# Patient Record
Sex: Female | Born: 1990 | Hispanic: Yes | Smoking: Never smoker
Health system: Southern US, Community
[De-identification: ages and names within clinical notes are randomized; demographics above are authoritative.]

## PROBLEM LIST (undated history)

## (undated) DIAGNOSIS — IMO0002 Reserved for concepts with insufficient information to code with codable children: Secondary | ICD-10-CM

## (undated) DIAGNOSIS — K5900 Constipation, unspecified: Secondary | ICD-10-CM

## (undated) DIAGNOSIS — G43909 Migraine, unspecified, not intractable, without status migrainosus: Secondary | ICD-10-CM

## (undated) DIAGNOSIS — B009 Herpesviral infection, unspecified: Secondary | ICD-10-CM

## (undated) DIAGNOSIS — J45909 Unspecified asthma, uncomplicated: Secondary | ICD-10-CM

## (undated) DIAGNOSIS — M329 Systemic lupus erythematosus, unspecified: Secondary | ICD-10-CM

## (undated) DIAGNOSIS — R87629 Unspecified abnormal cytological findings in specimens from vagina: Secondary | ICD-10-CM

## (undated) DIAGNOSIS — T7840XA Allergy, unspecified, initial encounter: Secondary | ICD-10-CM

## (undated) HISTORY — PX: OTHER SURGICAL HISTORY: SHX169

## (undated) HISTORY — PX: NO PAST SURGERIES: SHX2092

## (undated) HISTORY — DX: Allergy, unspecified, initial encounter: T78.40XA

## (undated) HISTORY — DX: Unspecified asthma, uncomplicated: J45.909

## (undated) HISTORY — DX: Unspecified abnormal cytological findings in specimens from vagina: R87.629

## (undated) HISTORY — DX: Systemic lupus erythematosus, unspecified: M32.9

## (undated) HISTORY — DX: Herpesviral infection, unspecified: B00.9

---

## 2010-08-24 DIAGNOSIS — N83209 Unspecified ovarian cyst, unspecified side: Secondary | ICD-10-CM

## 2010-08-24 HISTORY — DX: Unspecified ovarian cyst, unspecified side: N83.209

## 2015-03-21 ENCOUNTER — Emergency Department: Payer: Self-pay

## 2015-03-21 ENCOUNTER — Emergency Department
Admission: EM | Admit: 2015-03-21 | Discharge: 2015-03-21 | Disposition: A | Discharge status: Home / Self Care | Payer: Self-pay | Attending: Emergency Medicine | Admitting: Emergency Medicine

## 2015-03-21 DIAGNOSIS — Z880 Allergy status to penicillin: Secondary | ICD-10-CM | POA: Insufficient documentation

## 2015-03-21 DIAGNOSIS — J069 Acute upper respiratory infection, unspecified: Secondary | ICD-10-CM | POA: Insufficient documentation

## 2015-03-21 DIAGNOSIS — Z3202 Encounter for pregnancy test, result negative: Secondary | ICD-10-CM | POA: Insufficient documentation

## 2015-03-21 DIAGNOSIS — N12 Tubulo-interstitial nephritis, not specified as acute or chronic: Secondary | ICD-10-CM | POA: Insufficient documentation

## 2015-03-21 HISTORY — DX: Constipation, unspecified: K59.00

## 2015-03-21 HISTORY — DX: Migraine, unspecified, not intractable, without status migrainosus: G43.909

## 2015-03-21 LAB — COMPREHENSIVE METABOLIC PANEL
ALT: 53 U/L (ref 14–54)
AST: 25 U/L (ref 15–41)
Albumin: 3.4 g/dL — ABNORMAL LOW (ref 3.5–5.0)
Alkaline Phosphatase: 115 U/L (ref 38–126)
Anion gap: 9 (ref 5–15)
BUN: 9 mg/dL (ref 6–20)
CO2: 27 mmol/L (ref 22–32)
Calcium: 8.8 mg/dL — ABNORMAL LOW (ref 8.9–10.3)
Chloride: 96 mmol/L — ABNORMAL LOW (ref 101–111)
Creatinine, Ser: 0.57 mg/dL (ref 0.44–1.00)
GFR calc Af Amer: >60 mL/min (ref >60)
GFR calc non Af Amer: >60 mL/min (ref >60)
Glucose, Bld: 90 mg/dL (ref 65–99)
Potassium: 4 mmol/L (ref 3.5–5.1)
Sodium: 132 mmol/L — ABNORMAL LOW (ref 135–145)
Total Bilirubin: 0.2 mg/dL — ABNORMAL LOW (ref 0.3–1.2)
Total Protein: 8.5 g/dL — ABNORMAL HIGH (ref 6.5–8.1)

## 2015-03-21 LAB — URINALYSIS COMPLETE WITH MICROSCOPIC (ARMC ONLY)
Bilirubin Urine: NEGATIVE
Glucose, UA: NEGATIVE mg/dL
Hgb urine dipstick: NEGATIVE
Ketones, ur: NEGATIVE mg/dL
Nitrite: NEGATIVE
Protein, ur: 30 mg/dL — AB
Specific Gravity, Urine: 1.014 (ref 1.005–1.030)
pH: 9 — ABNORMAL HIGH (ref 5.0–8.0)

## 2015-03-21 LAB — LIPASE, BLOOD: Lipase: 17 U/L — ABNORMAL LOW (ref 22–51)

## 2015-03-21 LAB — CBC
HCT: 36.4 % (ref 35.0–47.0)
Hemoglobin: 12.1 g/dL (ref 12.0–16.0)
MCH: 29.5 pg (ref 26.0–34.0)
MCHC: 33.2 g/dL (ref 32.0–36.0)
MCV: 88.7 fL (ref 80.0–100.0)
Platelets: 291 10*3/uL (ref 150–440)
RBC: 4.11 MIL/uL (ref 3.80–5.20)
RDW: 13.1 % (ref 11.5–14.5)
WBC: 13.9 10*3/uL — ABNORMAL HIGH (ref 3.6–11.0)

## 2015-03-21 LAB — POCT PREGNANCY, URINE: Preg Test, Ur: NEGATIVE

## 2015-03-21 MED ORDER — CEFTRIAXONE SODIUM IN DEXTROSE 20 MG/ML IV SOLN: 1.0000 g | Freq: Once | INTRAVENOUS | Status: DC

## 2015-03-21 MED ORDER — BENZONATATE 100 MG PO CAPS
200.0000 mg | ORAL_CAPSULE | Freq: Once | ORAL | Status: AC
  Administered 2015-03-21: 200 mg via ORAL

## 2015-03-21 MED ORDER — IBUPROFEN 600 MG PO TABS: 600.0000 mg | ORAL_TABLET | Freq: Three times a day (TID) | ORAL | Status: DC | PRN

## 2015-03-21 MED ORDER — IBUPROFEN 600 MG PO TABS
ORAL_TABLET | ORAL | Status: AC
  Administered 2015-03-21: 600 mg via ORAL
  Filled 2015-03-21: qty 1

## 2015-03-21 MED ORDER — BENZONATATE 200 MG PO CAPS: 200.0000 mg | ORAL_CAPSULE | Freq: Three times a day (TID) | ORAL | Status: DC | PRN

## 2015-03-21 MED ORDER — ACETAMINOPHEN 325 MG PO TABS
650.0000 mg | ORAL_TABLET | Freq: Once | ORAL | Status: AC | PRN
  Administered 2015-03-21: 650 mg via ORAL
  Filled 2015-03-21: qty 2

## 2015-03-21 MED ORDER — DEXTROSE 5 % IV SOLN
1.0000 g | Freq: Once | INTRAVENOUS | Status: AC
  Administered 2015-03-21: 1 g via INTRAVENOUS
  Filled 2015-03-21 (×2): qty 10

## 2015-03-21 MED ORDER — BENZONATATE 100 MG PO CAPS
ORAL_CAPSULE | ORAL | Status: AC
Start: 2015-03-21 — End: 2015-03-21
  Administered 2015-03-21: 200 mg via ORAL
  Filled 2015-03-21: qty 2

## 2015-03-21 MED ORDER — IBUPROFEN 400 MG PO TABS
600.0000 mg | ORAL_TABLET | Freq: Once | ORAL | Status: AC
  Administered 2015-03-21: 600 mg via ORAL

## 2015-03-21 MED ORDER — SULFAMETHOXAZOLE-TRIMETHOPRIM 800-160 MG PO TABS: 1.0000 | ORAL_TABLET | Freq: Two times a day (BID) | ORAL | Status: DC

## 2015-03-21 NOTE — ED Provider Notes (Signed)
[facility] Emergency Department Provider Note   ____________________________________________  Time seen: 6 PM I have reviewed the triage vital signs and the triage nursing note.  HISTORY  Chief Complaint Fever; Sore Throat; and Abdominal Pain   Historian Patient, through Spanish interpreter  HPI Brittany Cardenas is a 24 y.o. female who is complaining of right-sided flank and abdominal pain for about one week. Symptoms are getting worse. She has had generalized fatigue and muscle aches all over.She's had some headaches. She has had constipation for over one week and took a laxative 2 days ago. She is also having a sore throat and a cough that is nonproductive.    Past Medical History  Diagnosis Date  . Constipation   . Migraine     There are no active problems to display for this patient.   History reviewed. No pertinent past surgical history.  Current Outpatient Rx  Name  Route  Sig  Dispense  Refill  . benzonatate (TESSALON) 200 MG capsule   Oral   Take 1 capsule (200 mg total) by mouth 3 (three) times daily as needed for cough.   20 capsule   0   . ibuprofen (ADVIL,MOTRIN) 600 MG tablet   Oral   Take 1 tablet (600 mg total) by mouth every 8 (eight) hours as needed.   20 tablet   0   . sulfamethoxazole-trimethoprim (BACTRIM DS,SEPTRA DS) 800-160 MG per tablet   Oral   Take 1 tablet by mouth 2 (two) times daily.   20 tablet   0     Allergies Penicillins caused slight rash when she was a child  No family history on file.  Social History History  Substance Use Topics  . Smoking status: Never Smoker   . Smokeless tobacco: Not on file  . Alcohol Use: No    Review of Systems  Constitutional: Positive for fever. Eyes: Negative for visual changes. ENT: Positive for sore throat. Cardiovascular: Negative for chest pain. Respiratory: Negative for shortness of breath. Positive for cough Gastrointestinal: Negative for,  vomiting and diarrhea. Genitourinary: Negative for dysuria. Musculoskeletal: Negative for back pain. Skin: Negative for rash. Neurological: Negative for headaches, focal weakness or numbness. 10 point Review of Systems otherwise negative ____________________________________________   PHYSICAL EXAM:  VITAL SIGNS: ED Triage Vitals  Enc Vitals Group     BP 03/21/15 1405 129/80 mmHg     Pulse Rate 03/21/15 1405 128     Resp 03/21/15 1405 18     Temp 03/21/15 1405 103.1 F (39.5 C)     Temp Source 03/21/15 1405 Oral     SpO2 03/21/15 1405 99 %     Weight 03/21/15 1405 86 lb 14 oz (39.406 kg)     Height 03/21/15 1405  (1.575 m)     Head Cir --      Peak Flow --      Pain Score 03/21/15 1408 10     Pain Loc --      Pain Edu? --      Excl. in GC? --      Constitutional: Alert and oriented. Well appearing and in no distress. Eyes: Conjunctivae are normal. PERRL. Normal extraocular movements. ENT   Head: Normocephalic and atraumatic.   Nose: No congestion/rhinnorhea.   Mouth/Throat: Mucous membranes are moist. Moderate pharyngeal erythema without swollen tonsils or tonsillar exudates.   Neck: No stridor. Cardiovascular/Chest: Normal rate, regular rhythm.  No murmurs, rubs, or gallops. Respiratory: Normal respiratory effort without tachypnea  nor retractions. Breath sounds are clear and equal bilaterally. No wheezes/rales/rhonchi. Gastrointestinal: Soft. No distention, no guarding, no rebound. Nontender  Genitourinary/rectal:Deferred Musculoskeletal: Nontender with normal range of motion in all extremities. No joint effusions.  No lower extremity tenderness nor edema. Neurologic:  Normal speech and language. No gross or focal neurologic deficits are appreciated. Skin:  Skin is warm, dry and intact. No rash noted. Psychiatric: Mood and affect are normal. Speech and behavior are normal. Patient exhibits appropriate insight and  judgment.  ____________________________________________   EKG I, Governor Rooks, MD, the attending physician have personally viewed and interpreted all ECGs.  None ____________________________________________  LABS (pertinent positives/negatives)  Rapid strep negative Pregnancy test negative Lipase low 17 Complete metabolic panel significant for sodium 132, chloride 96 and LFTs within normal limits White blood cell count is 13.9, hemoglobin 12.1 Urinalysis positive for 3+ leukocytes, 6-30 red blood cells, too numerous to count white blood cells and a few bacteria  ____________________________________________  RADIOLOGY All Xrays were viewed by me. Imaging interpreted by Radiologist.  Chest x-ray: Normal __________________________________________  PROCEDURES  Procedure(s) performed: None Critical Care performed: None  ____________________________________________   ED COURSE / ASSESSMENT AND PLAN  CONSULTATIONS: None  Pertinent labs & imaging results that were available during my care of the patient were reviewed by me and considered in my medical decision making (see chart for details). I think the source of the patient's fever is pyelonephritis. She is overall well appearing but did have a fever with elevated white blood cell count and a urinalysis consistent with a urinary tract infection. No hypotension, and she is overall well-appearing at the she is stable for outpatient treatment. She didn't receive one dose of IV Rocephin here in the emergency department followed by Bactrim antibiotic starting tomorrow. In terms of the upper respiratory symptoms, I think is more viral upper respiratory infection with reassuring chest x-ray. I deferred a pelvic exam as she is not having specific pelvic complaints, and there is no clear source of her symptoms which is the urinary tract infection/pyelonephritis.  Patient / Family / Caregiver informed of clinical course, medical  decision-making process, and agree with plan.   I discussed return precautions, follow-up instructions, and discharged instructions with patient and/or family.  ___________________________________________   FINAL CLINICAL IMPRESSION(S) / ED DIAGNOSES   Final diagnoses:  Pyelonephritis  Upper respiratory infection, viral    FOLLOW UP  Referred to: Primary care follow-up, [facility] clinic   Governor Rooks, MD 03/21/15 [phone]

## 2015-03-21 NOTE — ED Notes (Signed)
MD at bedside. 

## 2015-03-21 NOTE — ED Notes (Signed)
Pt c/o fever, headache, generalized bone aching, abdominal pain, constipation x 1 week. Sore throat, nonproductive cough.

## 2015-03-21 NOTE — Discharge Instructions (Signed)
Return to the emergency department for any worsening condition including pain, vomiting, not making urine, trouble breathing or any other symptoms concerning to you.     Pyelonephritis, Adult Pyelonephritis is a kidney infection. In general, there are 2 main types of pyelonephritis:  Infections that come on quickly without any warning (acute pyelonephritis).  Infections that persist for a long period of time (chronic pyelonephritis). CAUSES  Two main causes of pyelonephritis are:  Bacteria traveling from the bladder to the kidney. This is a problem especially in pregnant women. The urine in the bladder can become filled with bacteria from multiple causes, including:  Inflammation of the prostate gland (prostatitis).  Sexual intercourse in females.  Bladder infection (cystitis).  Bacteria traveling from the bloodstream to the tissue part of the kidney. Problems that may increase your risk of getting a kidney infection include:  Diabetes.  Kidney stones or bladder stones.  Cancer.  Catheters placed in the bladder.  Other abnormalities of the kidney or ureter. SYMPTOMS   Abdominal pain.  Pain in the side or flank area.  Fever.  Chills.  Upset stomach.  Blood in the urine (dark urine).  Frequent urination.  Strong or persistent urge to urinate.  Burning or stinging when urinating. DIAGNOSIS  Your caregiver may diagnose your kidney infection based on your symptoms. A urine sample may also be taken. TREATMENT  In general, treatment depends on how severe the infection is.   If the infection is mild and caught early, your caregiver may treat you with oral antibiotics and send you home.  If the infection is more severe, the bacteria may have gotten into the bloodstream. This will require intravenous (IV) antibiotics and a hospital stay. Symptoms may include:  High fever.  Severe flank pain.  Shaking chills.  Even after a hospital stay, your caregiver may  require you to be on oral antibiotics for a period of time.  Other treatments may be required depending upon the cause of the infection. HOME CARE INSTRUCTIONS   Take your antibiotics as directed. Finish them even if you start to feel better.  Make an appointment to have your urine checked to make sure the infection is gone.  Drink enough fluids to keep your urine clear or pale yellow.  Take medicines for the bladder if you have urgency and frequency of urination as directed by your caregiver. SEEK IMMEDIATE MEDICAL CARE IF:   You have a fever or persistent symptoms for more than 2-3 days.  You have a fever and your symptoms suddenly get worse.  You are unable to take your antibiotics or fluids.  You develop shaking chills.  You experience extreme weakness or fainting.  There is no improvement after 2 days of treatment. MAKE SURE YOU:  Understand these instructions.  Will watch your condition.  Will get help right away if you are not doing well or get worse. Document Released: 08/10/2005 Document Revised: 02/09/2012 Document Reviewed: 01/14/2011 [employer] Patient Information 2015 [place], [place]. This information is not intended to replace advice given to you by your health care provider. Make sure you discuss any questions you have with your health care provider.  Upper Respiratory Infection, Adult An upper respiratory infection (URI) is also known as the common cold. It is often caused by a type of germ (virus). Colds are easily spread (contagious). You can pass it to others by kissing, coughing, sneezing, or drinking out of the same glass. Usually, you get better in 1 or 2 weeks.  HOME  CARE   Only take medicine as told by your doctor.  Use a warm mist humidifier or breathe in steam from a hot shower.  Drink enough water and fluids to keep your pee (urine) clear or pale yellow.  Get plenty of rest.  Return to work when your temperature is back to normal or as told by  your doctor. You may use a face mask and wash your hands to stop your cold from spreading. GET HELP RIGHT AWAY IF:   After the first few days, you feel you are getting worse.  You have questions about your medicine.  You have chills, shortness of breath, or brown or red spit (mucus).  You have yellow or brown snot (nasal discharge) or pain in the face, especially when you bend forward.  You have a fever, puffy (swollen) neck, pain when you swallow, or white spots in the back of your throat.  You have a bad headache, ear pain, sinus pain, or chest pain.  You have a high-pitched whistling sound when you breathe in and out (wheezing).  You have a lasting cough or cough up blood.  You have sore muscles or a stiff neck. MAKE SURE YOU:   Understand these instructions.  Will watch your condition.  Will get help right away if you are not doing well or get worse. Document Released: 01/27/2008 Document Revised: 11/02/2011 Document Reviewed: 11/15/2013 [facility] Patient Information 2015 [place], [place]. This information is not intended to replace advice given to you by your health care provider. Make sure you discuss any questions you have with your health care provider.

## 2015-03-21 NOTE — ED Notes (Signed)
Patient c/o right upper abdominal pain, spine pain, and a headache that has been going on for a week.  Patient states that she had not had a bowel movement for 2 weeks until she took a laxative 2 days ago.  States that she has lost ~20lb in the past week.

## 2015-03-23 LAB — CULTURE, GROUP A STREP (THRC)

## 2015-10-11 DIAGNOSIS — N879 Dysplasia of cervix uteri, unspecified: Secondary | ICD-10-CM | POA: Insufficient documentation

## 2016-04-06 ENCOUNTER — Emergency Department
Admission: EM | Admit: 2016-04-06 | Discharge: 2016-04-07 | Disposition: A | Discharge status: Home / Self Care | Payer: Self-pay | Attending: Emergency Medicine | Admitting: Emergency Medicine

## 2016-04-06 DIAGNOSIS — Z79899 Other long term (current) drug therapy: Secondary | ICD-10-CM | POA: Insufficient documentation

## 2016-04-06 DIAGNOSIS — N12 Tubulo-interstitial nephritis, not specified as acute or chronic: Secondary | ICD-10-CM | POA: Insufficient documentation

## 2016-04-06 NOTE — ED Triage Notes (Addendum)
Patient ambulatory to triage with steady gait, without difficulty or distress noted; pt reports pain to right lower abd /back with fever and chills; miscarriage 7/30 due to rH negative blood type

## 2016-04-07 LAB — COMPREHENSIVE METABOLIC PANEL
ALT: 22 U/L (ref 14–54)
AST: 19 U/L (ref 15–41)
Albumin: 4.2 g/dL (ref 3.5–5.0)
Alkaline Phosphatase: 83 U/L (ref 38–126)
Anion gap: 11 (ref 5–15)
BUN: 5 mg/dL — ABNORMAL LOW (ref 6–20)
CO2: 24 mmol/L (ref 22–32)
Calcium: 9.1 mg/dL (ref 8.9–10.3)
Chloride: 103 mmol/L (ref 101–111)
Creatinine, Ser: 0.71 mg/dL (ref 0.44–1.00)
GFR calc Af Amer: >60 mL/min (ref >60)
GFR calc non Af Amer: >60 mL/min (ref >60)
Glucose, Bld: 107 mg/dL — ABNORMAL HIGH (ref 65–99)
Potassium: 3.3 mmol/L — ABNORMAL LOW (ref 3.5–5.1)
Sodium: 138 mmol/L (ref 135–145)
Total Bilirubin: 0.7 mg/dL (ref 0.3–1.2)
Total Protein: 8.3 g/dL — ABNORMAL HIGH (ref 6.5–8.1)

## 2016-04-07 LAB — CBC WITH DIFFERENTIAL/PLATELET
Basophils Absolute: 0 10*3/uL (ref 0–0.1)
Basophils Relative: 0 %
Eosinophils Absolute: 0.1 10*3/uL (ref 0–0.7)
Eosinophils Relative: 1 %
HCT: 38.3 % (ref 35.0–47.0)
Hemoglobin: 13.1 g/dL (ref 12.0–16.0)
Lymphocytes Relative: 9 %
Lymphs Abs: 1.3 10*3/uL (ref 1.0–3.6)
MCH: 29.9 pg (ref 26.0–34.0)
MCHC: 34.2 g/dL (ref 32.0–36.0)
MCV: 87.4 fL (ref 80.0–100.0)
Monocytes Absolute: 1.3 10*3/uL — ABNORMAL HIGH (ref 0.2–0.9)
Monocytes Relative: 9 %
Neutro Abs: 11.3 10*3/uL — ABNORMAL HIGH (ref 1.4–6.5)
Neutrophils Relative %: 81 %
Platelets: 221 10*3/uL (ref 150–440)
RBC: 4.38 MIL/uL (ref 3.80–5.20)
RDW: 13.1 % (ref 11.5–14.5)
WBC: 13.9 10*3/uL — ABNORMAL HIGH (ref 3.6–11.0)

## 2016-04-07 LAB — URINALYSIS COMPLETE WITH MICROSCOPIC (ARMC ONLY)
Bilirubin Urine: NEGATIVE
Glucose, UA: NEGATIVE mg/dL
Nitrite: POSITIVE — AB
Protein, ur: 30 mg/dL — AB
Specific Gravity, Urine: 1.017 (ref 1.005–1.030)
pH: 5 (ref 5.0–8.0)

## 2016-04-07 LAB — LACTIC ACID, PLASMA: Lactic Acid, Venous: 0.7 mmol/L (ref 0.5–1.9)

## 2016-04-07 LAB — LIPASE, BLOOD: Lipase: 17 U/L (ref 11–51)

## 2016-04-07 LAB — POCT PREGNANCY, URINE: Preg Test, Ur: NEGATIVE

## 2016-04-07 MED ORDER — CEPHALEXIN 500 MG PO CAPS: 500.0000 mg | ORAL_CAPSULE | Freq: Three times a day (TID) | ORAL | 0 refills | Status: DC

## 2016-04-07 MED ORDER — MORPHINE SULFATE (PF) 4 MG/ML IV SOLN
INTRAVENOUS | Status: AC
  Administered 2016-04-07: 4 mg via INTRAVENOUS
  Filled 2016-04-07: qty 1

## 2016-04-07 MED ORDER — SODIUM CHLORIDE 0.9 % IV BOLUS (SEPSIS)
1000.0000 mL | Freq: Once | INTRAVENOUS | Status: AC
  Administered 2016-04-07: 1000 mL via INTRAVENOUS

## 2016-04-07 MED ORDER — CEFTRIAXONE SODIUM 1 G IJ SOLR
1.0000 g | Freq: Once | INTRAMUSCULAR | Status: AC
  Administered 2016-04-07: 1 g via INTRAVENOUS
  Filled 2016-04-07: qty 10

## 2016-04-07 MED ORDER — MORPHINE SULFATE (PF) 4 MG/ML IV SOLN
4.0000 mg | Freq: Once | INTRAVENOUS | Status: AC
  Administered 2016-04-07: 4 mg via INTRAVENOUS

## 2016-04-07 NOTE — Discharge Instructions (Signed)
Please seek medical attention for any high fevers, chest pain, shortness of breath, change in behavior, persistent vomiting, bloody stool or any other new or concerning symptoms.  

## 2016-04-07 NOTE — ED Notes (Signed)
Pt is complaining of right flank and lower back pain.  Reports nausea and vomiting over the past 3 days. Has a self-reported history of constipation but reports no change in her bowel pattern. Has reported no new urinary symptoms. Feels as though she has had fevers and chills but has not documented a fever using a thermometer.

## 2016-04-07 NOTE — ED Provider Notes (Signed)
[facility] Emergency Department Provider Note   ____________________________________________   I have reviewed the triage vital signs and the nursing notes.   HISTORY  Chief Complaint Right lower abd/back pain  History limited by: Not Limited   HPI Brittany Cardenas is a 25 y.o. female who presents to the emergency department today because of concern for right sided pain. The patient states the pain has been present for about one week. It is sharp. It is constant. She has tried taking ibuprofen without significant relief. The patient additionally has been having fevers. She denies any dysuria or change in defecation, although she does state she is typically constipated. The patient was seen in the emergency department roughly one year ago for somewhat similar complaints and was diagnosed with pyelonephritis. The patient thinks that this might be similar.   Past Medical History:  Diagnosis Date  . Constipation   . Migraine     There are no active problems to display for this patient.   No past surgical history on file.  Prior to Admission medications   Medication Sig Start Date End Date Taking? Authorizing Provider  benzonatate (TESSALON) 200 MG capsule Take 1 capsule (200 mg total) by mouth 3 (three) times daily as needed for cough. 03/21/15   Governor Rooksebecca Lord, MD  ibuprofen (ADVIL,MOTRIN) 600 MG tablet Take 1 tablet (600 mg total) by mouth every 8 (eight) hours as needed. 03/21/15   Governor Rooksebecca Lord, MD  sulfamethoxazole-trimethoprim (BACTRIM DS,SEPTRA DS) 800-160 MG per tablet Take 1 tablet by mouth 2 (two) times daily. 03/21/15   Governor Rooksebecca Lord, MD    Allergies Penicillins  No family history on file.  Social History Social History  Substance Use Topics  . Smoking status: Never Smoker  . Smokeless tobacco: Not on file  . Alcohol use No    Review of Systems  Constitutional: Positive for fever. Cardiovascular: Negative for chest  pain. Respiratory: Negative for shortness of breath. Gastrointestinal: Positive for right abdominal pain. Genitourinary: Negative for dysuria. Skin: Negative for rash. Neurological: Negative for headaches, focal weakness or numbness.  10-point ROS otherwise negative.  ____________________________________________   PHYSICAL EXAM:  VITAL SIGNS: ED Triage Vitals  Enc Vitals Group     BP 04/06/16 2340 118/75     Pulse Rate 04/06/16 2340 (!) 132     Resp 04/06/16 2340 (!) 22     Temp 04/06/16 2340 98.4 F (36.9 C)     Temp src --      SpO2 04/06/16 2340 97 %     Weight --      Height 04/06/16 2341 5' (1.524 m)     Head Circumference --      Peak Flow --      Pain Score 04/06/16 2341 8   Constitutional: Alert and oriented. Well appearing and in no distress. Eyes: Conjunctivae are normal. PERRL. Normal extraocular movements. ENT   Head: Normocephalic and atraumatic.   Nose: No congestion/rhinnorhea.   Mouth/Throat: Mucous membranes are moist.   Neck: No stridor. Hematological/Lymphatic/Immunilogical: No cervical lymphadenopathy. Cardiovascular: tachycardic, regular rhythm.  No murmurs, rubs, or gallops. Respiratory: Normal respiratory effort without tachypnea nor retractions. Breath sounds are clear and equal bilaterally. No wheezes/rales/rhonchi. Gastrointestinal: Soft and minimally tender in the right lower quadrant. No distention. Mild right sided cva tenderness.  Genitourinary: Deferred Musculoskeletal: Normal range of motion in all extremities. No joint effusions.  No lower extremity tenderness nor edema. Neurologic:  Normal speech and language. No gross focal neurologic deficits are appreciated.  Skin:  Skin is warm, dry and intact. No rash noted. Psychiatric: Mood and affect are normal. Speech and behavior are normal. Patient exhibits appropriate insight and judgment.  ____________________________________________    LABS (pertinent  positives/negatives)  Labs Reviewed  CBC WITH DIFFERENTIAL/PLATELET - Abnormal; Notable for the following:       Result Value   WBC 13.9 (*)    Neutro Abs 11.3 (*)    Monocytes Absolute 1.3 (*)    All other components within normal limits  COMPREHENSIVE METABOLIC PANEL - Abnormal; Notable for the following:    Potassium 3.3 (*)    Glucose, Bld 107 (*)    BUN 5 (*)    Total Protein 8.3 (*)    All other components within normal limits  URINALYSIS COMPLETEWITH MICROSCOPIC ([facility] ONLY) - Abnormal; Notable for the following:    Color, Urine AMBER (*)    APPearance CLOUDY (*)    Ketones, ur 1+ (*)    Hgb urine dipstick 2+ (*)    Protein, ur 30 (*)    Nitrite POSITIVE (*)    Leukocytes, UA 3+ (*)    Bacteria, UA RARE (*)    Squamous Epithelial / LPF 6-30 (*)    All other components within normal limits  URINE CULTURE  LACTIC ACID, PLASMA  LIPASE, BLOOD  POC URINE PREG, ED  POCT PREGNANCY, URINE     ____________________________________________   EKG  I, Phineas SemenGraydon Gloriann Riede, attending physician, personally viewed and interpreted this EKG  EKG Time: 0026 Rate: 112 Rhythm: sinus tachycardia Axis: normal Intervals: qtc 417 QRS: narrow ST changes: no st elevation Impression: abnormal ekg   ____________________________________________    RADIOLOGY  None  ____________________________________________   PROCEDURES  Procedures  ____________________________________________   INITIAL IMPRESSION / ASSESSMENT AND PLAN / ED COURSE  Pertinent labs & imaging results that were available during my care of the patient were reviewed by me and considered in my medical decision making (see chart for details).  She presented to the emergency department today because of concerns for right-sided abdominal pain and fever. Workup was consistent with pyelonephritis. Patient's heart rate did improve with IV fluids. Patient was given dose of IV Rocephin here and will given prescription  for Keflex. Additionally will give patient multiple primary care physician follow-up options.   ____________________________________________   FINAL CLINICAL IMPRESSION(S) / ED DIAGNOSES  Final diagnoses:  Pyelonephritis     Note: This dictation was prepared with Dragon dictation. Any transcriptional errors that result from this process are unintentional    Phineas SemenGraydon Aydrien Froman, MD 04/07/16 [phone]

## 2016-04-09 LAB — URINE CULTURE: Culture: >=100000 — AB

## 2016-04-10 NOTE — Progress Notes (Signed)
25 yo female who presented to the ED on 04/07/16. Reviewed urine culture and patient discharged on appropriate antibiotic therapy.   Brittany Cardenas  3:30 PM 04/10/2016

## 2018-11-03 ENCOUNTER — Emergency Department
Admission: EM | Admit: 2018-11-03 | Discharge: 2018-11-03 | Disposition: A | Discharge status: Home / Self Care | Payer: Self-pay | Attending: Emergency Medicine | Admitting: Emergency Medicine

## 2018-11-03 ENCOUNTER — Encounter: Payer: Self-pay | Admitting: Emergency Medicine

## 2018-11-03 ENCOUNTER — Emergency Department: Payer: Self-pay

## 2018-11-03 ENCOUNTER — Other Ambulatory Visit: Payer: Self-pay

## 2018-11-03 DIAGNOSIS — J209 Acute bronchitis, unspecified: Secondary | ICD-10-CM | POA: Insufficient documentation

## 2018-11-03 MED ORDER — PSEUDOEPH-BROMPHEN-DM 30-2-10 MG/5ML PO SYRP: 5.0000 mL | ORAL_SOLUTION | Freq: Four times a day (QID) | ORAL | 0 refills | Status: DC | PRN

## 2018-11-03 MED ORDER — AZITHROMYCIN 250 MG PO TABS: ORAL_TABLET | ORAL | 0 refills | Status: AC

## 2018-11-03 NOTE — ED Notes (Signed)
See triage note  Presents with cough  States cough is prod at times  Afebrile on arrival

## 2018-11-03 NOTE — ED Provider Notes (Addendum)
[facility] Emergency Department Provider Note   ____________________________________________   First MD Initiated Contact with Patient 11/03/18 1545     (approximate)  I have reviewed the triage vital signs and the nursing notes.   HISTORY  Chief Complaint Cough    HPI Symphanie Smallridge is a 28 y.o. female patient complain productive cough for 3 days.  Patient unsure of fever.  Patient also has a sore throat secondary to postnasal drainage.  Patient denies nausea, vomiting, diarrhea.  Patient has taken flu shot for this season.  Patient denies chest pain or shortness of breath.  No palliative measure for complaint.         Past Medical History:  Diagnosis Date  . Constipation   . Migraine     There are no active problems to display for this patient.   History reviewed. No pertinent surgical history.  Prior to Admission medications   Medication Sig Start Date End Date Taking? Authorizing Provider  azithromycin (ZITHROMAX Z-PAK) 250 MG tablet Take 2 tablets (500 mg) on  Day 1,  followed by 1 tablet (250 mg) once daily on Days 2 through 5. 11/03/18 11/08/18  Joni Reining, PA-C  brompheniramine-pseudoephedrine-DM 30-2-10 MG/5ML syrup Take 5 mLs by mouth 4 (four) times daily as needed. 11/03/18   Joni Reining, PA-C  cephALEXin (KEFLEX) 500 MG capsule Take 1 capsule (500 mg total) by mouth 3 (three) times daily. 04/07/16   Phineas Semen, MD    Allergies Penicillins  History reviewed. No pertinent family history.  Social History Social History   Tobacco Use  . Smoking status: Never Smoker  . Smokeless tobacco: Never Used  Substance Use Topics  . Alcohol use: No  . Drug use: Not on file    Review of Systems  Constitutional: No fever/chills Eyes: No visual changes. ENT: Nasal congestion and sore throat.   Cardiovascular: Denies chest pain. Respiratory: Denies shortness of breath. Gastrointestinal: No abdominal pain.  No  nausea, no vomiting.  No diarrhea.  No constipation. Genitourinary: Negative for dysuria. Musculoskeletal: Negative for back pain. Skin: Negative for rash. Neurological: Negative for headaches, focal weakness or numbness. Allergic/Immunilogical: Penicillin. ____________________________________________   PHYSICAL EXAM:  VITAL SIGNS: ED Triage Vitals  Enc Vitals Group     BP 11/03/18 1526 125/67     Pulse Rate 11/03/18 1526 85     Resp 11/03/18 1526 20     Temp 11/03/18 1526 98.5 F (36.9 C)     Temp Source 11/03/18 1526 Oral     SpO2 11/03/18 1526 98 %     Weight 11/03/18 1527 142 lb 9.6 oz (64.7 kg)     Height 11/03/18 1527 5\' 4"  (1.626 m)     Head Circumference --      Peak Flow --      Pain Score --      Pain Loc --      Pain Edu? --      Excl. in GC? --     Constitutional: Alert and oriented. Well appearing and in no acute distress. Nose: Edematous nasal turbinates clear rhinorrhea. Mouth/Throat: Mucous membranes are moist.  Oropharynx non-erythematous.  Nasal drainage. Neck: No stridor. Hematological/Lymphatic/Immunilogical: No cervical lymphadenopathy. Cardiovascular: Normal rate, regular rhythm. Grossly normal heart sounds.  Good peripheral circulation. Respiratory: Normal respiratory effort.  No retractions. Lungs bilateral rales. Neurologic:  Normal speech and language. No gross focal neurologic deficits are appreciated. No gait instability. Skin:  Skin is warm, dry and intact. No  rash noted. Psychiatric: Mood and affect are normal. Speech and behavior are normal.  ____________________________________________   LABS (all labs ordered are listed, but only abnormal results are displayed)  Labs Reviewed - No data to display ____________________________________________  EKG EKG read by heart station Dr. with no acute findings.  ____________________________________________  RADIOLOGY  ED MD interpretation:    Official radiology report(s): Dg Chest 2 View   Result Date: 11/03/2018 CLINICAL DATA:  Pt presents to ED via POV with her daughter who is also a patient with c/o cough, pt states hx of bronchitis. Pt states phlegm is frothy and white. Never a smoker EXAM: CHEST - 2 VIEW COMPARISON:  03/13/2015 FINDINGS: The heart size and mediastinal contours are within normal limits. Both lungs are clear. No pleural effusion or pneumothorax. The visualized skeletal structures are unremarkable. IMPRESSION: Normal chest radiographs. Electronically Signed   By: Amie Portland M.D.   On: 11/03/2018 16:01    ____________________________________________   PROCEDURES  Procedure(s) performed (including Critical Care):  Procedures   ____________________________________________   INITIAL IMPRESSION / ASSESSMENT AND PLAN / ED COURSE  As part of my medical decision making, I reviewed the following data within the electronic MEDICAL RECORD NUMBER         Presents with 3 days of productive cough.  Physical exam is consistent with bronchitis.  Discussed x-ray findings with patient.  Patient given discharge care instruction advised take medication as directed.  [facility]      ____________________________________________   FINAL CLINICAL IMPRESSION(S) / ED DIAGNOSES  Final diagnoses:  Acute bronchitis, unspecified organism     ED Discharge Orders         Ordered    azithromycin (ZITHROMAX Z-PAK) 250 MG tablet     11/03/18 1620    brompheniramine-pseudoephedrine-DM 30-2-10 MG/5ML syrup  4 times daily PRN     11/03/18 1620           Note:  This document was prepared using Dragon voice recognition software and may include unintentional dictation errors.    Joni Reining, PA-C 11/03/18 1625    Joni Reining, PA-C 11/03/18 1630    Sharman Cheek, MD 11/07/18 [phone]

## 2018-11-03 NOTE — ED Triage Notes (Addendum)
Pt presents to ED via POV with her daughter who is also a patient with c/o cough, pt states hx of bronchitis. Pt states phlegm is frothy and white.

## 2019-06-27 ENCOUNTER — Other Ambulatory Visit: Payer: Self-pay

## 2019-06-27 DIAGNOSIS — Z20822 Contact with and (suspected) exposure to covid-19: Secondary | ICD-10-CM

## 2019-06-28 LAB — NOVEL CORONAVIRUS, NAA: SARS-CoV-2, NAA: NOT DETECTED

## 2019-10-04 ENCOUNTER — Ambulatory Visit: Payer: Self-pay | Attending: Internal Medicine

## 2019-12-09 ENCOUNTER — Ambulatory Visit: Payer: Self-pay | Attending: Internal Medicine

## 2019-12-09 DIAGNOSIS — Z23 Encounter for immunization: Secondary | ICD-10-CM

## 2019-12-09 NOTE — Progress Notes (Signed)
   Covid-19 Vaccination Clinic  Name:  Brittany Cardenas    MRN: [id number] DOB: [birth year]  12/09/2019  [name] was observed post Covid-19 immunization for 15 minutes without incident. She was provided with Vaccine Information Sheet and instruction to access the V-Safe system.   [name] was instructed to call 911 with any severe reactions post vaccine: Marland Kitchen Difficulty breathing  . Swelling of face and throat  . A fast heartbeat  . A bad rash all over body  . Dizziness and weakness   Immunizations Administered    Name Date Dose VIS Date Route   Pfizer COVID-19 Vaccine 12/09/2019 12:25 PM 0.3 mL 08/04/2019 Intramuscular   Manufacturer: [employer], [employer]   Lot: [id number]   NDC: [address]-6

## 2020-01-02 ENCOUNTER — Ambulatory Visit: Payer: Self-pay | Attending: Internal Medicine

## 2020-01-02 DIAGNOSIS — Z23 Encounter for immunization: Secondary | ICD-10-CM

## 2020-01-02 NOTE — Progress Notes (Signed)
   Covid-19 Vaccination Clinic  Name:  Brittany Cardenas    MRN: [id number] DOB: [birth year]  01/02/2020  [name] was observed post Covid-19 immunization for 15 minutes without incident. She was provided with Vaccine Information Sheet and instruction to access the V-Safe system.   [name] was instructed to call 911 with any severe reactions post vaccine: Marland Kitchen Difficulty breathing  . Swelling of face and throat  . A fast heartbeat  . A bad rash all over body  . Dizziness and weakness   Immunizations Administered    Name Date Dose VIS Date Route   Pfizer COVID-19 Vaccine 01/02/2020  9:32 AM 0.3 mL 10/18/2018 Intramuscular   Manufacturer: [employer], [employer]   Lot: [id number]   NDC: [address]-6

## 2020-03-08 ENCOUNTER — Other Ambulatory Visit: Payer: Self-pay

## 2020-03-08 DIAGNOSIS — M542 Cervicalgia: Secondary | ICD-10-CM | POA: Insufficient documentation

## 2020-03-08 DIAGNOSIS — Z5321 Procedure and treatment not carried out due to patient leaving prior to being seen by health care provider: Secondary | ICD-10-CM | POA: Insufficient documentation

## 2020-03-08 DIAGNOSIS — M25511 Pain in right shoulder: Secondary | ICD-10-CM | POA: Insufficient documentation
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
## 2020-03-09 ENCOUNTER — Emergency Department
Admission: EM | Admit: 2020-03-09 | Discharge: 2020-03-09 | Disposition: A | Discharge status: Home / Self Care | Payer: Self-pay | Attending: Emergency Medicine | Admitting: Emergency Medicine

## 2020-05-06 ENCOUNTER — Other Ambulatory Visit: Payer: Self-pay

## 2020-05-06 ENCOUNTER — Ambulatory Visit: Payer: Self-pay | Admitting: Family Medicine

## 2020-05-06 DIAGNOSIS — B9689 Other specified bacterial agents as the cause of diseases classified elsewhere: Secondary | ICD-10-CM

## 2020-05-06 DIAGNOSIS — B009 Herpesviral infection, unspecified: Secondary | ICD-10-CM

## 2020-05-06 DIAGNOSIS — N760 Acute vaginitis: Secondary | ICD-10-CM

## 2020-05-06 DIAGNOSIS — Z113 Encounter for screening for infections with a predominantly sexual mode of transmission: Secondary | ICD-10-CM

## 2020-05-06 LAB — WET PREP FOR TRICH, YEAST, CLUE
Trichomonas Exam: NEGATIVE
Yeast Exam: NEGATIVE

## 2020-05-06 MED ORDER — ACYCLOVIR 800 MG PO TABS: 800.0000 mg | ORAL_TABLET | Freq: Three times a day (TID) | ORAL | 12 refills | Status: AC

## 2020-05-06 MED ORDER — METRONIDAZOLE 500 MG PO TABS: 500.0000 mg | ORAL_TABLET | Freq: Two times a day (BID) | ORAL | 0 refills | Status: AC

## 2020-05-06 NOTE — Progress Notes (Signed)
[facility] Department STI clinic/screening visit  Subjective:  Brittany Cardenas is a 29 y.o. female being seen today for an STI screening visit. The patient reports they do have symptoms.  Patient reports that they do not desire a pregnancy in the next year.   They reported they are not interested in discussing contraception today.  Patient's last menstrual period was 05/03/2020 (exact date).   Patient has the following medical conditions:  There are no problems to display for this patient.   Chief Complaint  Patient presents with  . SEXUALLY TRANSMITTED DISEASE    screening     HPI  Patient reports lower abdominal pain and green colored discharge x 2 months Last HIV test per record was 2020 Patient reports last pap was 2017  See flowsheet for further details and programmatic requirements.    The following portions of the patient's history were reviewed and updated as appropriate: allergies, current medications, past medical history, past social history, past surgical history and problem list.  Objective:  There were no vitals filed for this visit.  Physical Exam Constitutional:      Appearance: Normal appearance.  HENT:     Head: Normocephalic.     Comments: In scalp, brows and lashes: no nits, no hair loss    Mouth/Throat:     Mouth: Mucous membranes are moist.     Pharynx: Oropharynx is clear. No oropharyngeal exudate or posterior oropharyngeal erythema.  Abdominal:     General: Abdomen is flat.  Genitourinary:    Comments: External genitalia without, lice, nits, erythema, edema , lesions or inguinal adenopathy. Vagina with normal mucosa. Patient has large amount of green discharge in vaginal canal  and pH > 4.  Patient cervix appears erythematous, punctate, and papilliform appearance, uterus firm, mobile, non-tender, no masses, CMT adnexal fullness or mild tenderness with forced pressure     Musculoskeletal:     Cervical back: Normal range of  motion.  Lymphadenopathy:     Cervical: No cervical adenopathy.  Skin:    General: Skin is warm and dry.     Findings: Bruising present. No erythema, lesion or rash.     Comments: Slight bruising on bilateral thighs, patient reports from lifting weights.   Neurological:     Mental Status: She is alert.  Psychiatric:        Mood and Affect: Mood normal.        Behavior: Behavior normal.      Assessment and Plan:  Brittany Cardenas is a 29 y.o. female presenting to the [facility] Department for STI screening  1. Screening examination for venereal disease - Chlamydia/Gonorrhea [place] [facility] - HIV [place] [facility] - Syphilis Serology, [place] [facility] - WET PREP FOR TRICH, YEAST, CLUE  Patient accepted all screenings including vaginal CT/GC, wet prep and bloodwork for HIV/RPR.  Patient meets criteria for HepB screening? No. Ordered? No - n/a  Patient meets criteria for HepC screening? No. Ordered? No - n/a   2. Bacterial Vaginosis  -Wet prep results positive for Bacterial Vaginosis (BV)  Patient treated for BV and for indications of infection for Trichomonas on exam  with  Metronidazole 500 mg PO BID x 7 days.   Discussed time line for State Lab results and that patient will be called with positive results and encouraged patient to call if she had not heard in 2 weeks.  Counseled to return or seek care for continued or worsening symptoms Recommended condom  use with all sex  Patient is currently using Paraguard  to prevent pregnancy.      No follow-ups on file.  No future appointments.  Wendi Snipes, RN

## 2020-05-07 NOTE — Progress Notes (Addendum)
  Consulted by [facility] to recheck cervix exam and bimanual due to patient symptoms.  Vagina with normal mucosa and small amount of thin, yellowish/greenish discharge partially coating cervix.  IUD strings visualized.  Bimanual= uterus normal size, firm, mobile, non-tender, no CMT, no adnexal tenderness or fullness.  Reviewed ERRN documentation for flowsheet and exam note.  Agree that documentation meets STD program requirements/guidelines and standing orders.  Will sign sign since [facility] not cleared to sign documents yet.

## 2020-05-08 ENCOUNTER — Telehealth: Payer: Self-pay | Admitting: Licensed Clinical Social Worker

## 2020-05-08 NOTE — Telephone Encounter (Signed)
-----   Message from Wendi Snipes, RN sent at 05/07/2020  2:30 PM EDT ----- Lesleigh Noe,   This patient is interested in services with you.  I also gave her your card  Thanks [facility]

## 2020-05-21 ENCOUNTER — Telehealth: Payer: Self-pay | Admitting: Family Medicine

## 2020-05-21 NOTE — Telephone Encounter (Signed)
Phone call returned to patient at provided contact number. Patient requests TR from 05/06/20 OV. Chart password verified. All TR given from above appt. Patient states she continues to have symptoms and would like to come back in for a recheck and Pap Smear. PE, STI check and Pap Smear appt, scheduled for 05/24/2020 @ 11:00. Instructed patient to arrive at 10:30 for check in. Tawny Hopping, RN

## 2020-05-21 NOTE — Telephone Encounter (Signed)
Can you please call me for test results

## 2020-05-23 ENCOUNTER — Encounter: Payer: Self-pay | Admitting: Licensed Clinical Social Worker

## 2020-05-23 ENCOUNTER — Ambulatory Visit: Payer: Self-pay | Admitting: Licensed Clinical Social Worker

## 2020-05-23 DIAGNOSIS — F4323 Adjustment disorder with mixed anxiety and depressed mood: Secondary | ICD-10-CM
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
## 2020-05-24 ENCOUNTER — Other Ambulatory Visit: Payer: Self-pay | Admitting: Family Medicine

## 2020-05-24 ENCOUNTER — Ambulatory Visit: Payer: Self-pay | Admitting: Family Medicine

## 2020-05-24 ENCOUNTER — Other Ambulatory Visit: Payer: Self-pay

## 2020-05-24 ENCOUNTER — Encounter: Payer: Self-pay | Admitting: Family Medicine

## 2020-05-24 VITALS — BP 100/63 | Ht 64.0 in | Wt 128.6 lb

## 2020-05-24 DIAGNOSIS — Z01419 Encounter for gynecological examination (general) (routine) without abnormal findings: Secondary | ICD-10-CM

## 2020-05-24 DIAGNOSIS — B009 Herpesviral infection, unspecified: Secondary | ICD-10-CM | POA: Insufficient documentation

## 2020-05-24 MED ORDER — ACYCLOVIR 400 MG PO TABS: 400.0000 mg | ORAL_TABLET | Freq: Three times a day (TID) | ORAL | 3 refills | Status: AC

## 2020-05-24 NOTE — Progress Notes (Addendum)
Patient here for PE, Pap test and STD testing. Patient declines interpreter as she wants to practice her English (which is very good). Patient states she had an abnormal Pap in 2017 and went to [facility] for biopsy (records printed for provider). Patient using Paraguard for [facility].Marland KitchenBurt Knack, RN

## 2020-05-24 NOTE — Progress Notes (Signed)
[facility] [address] [place], [place] [address] Main Number: [phone]  Family Planning Visit- Initial Visit  Subjective:  Brittany Cardenas is a 29 y.o.  No obstetric history on file.  being seen today for an initial well woman visit and to discuss family planning options. Patient reports they do not want a pregnancy in the next year.   Chief Complaint  Patient presents with  . Annual Exam  . Abnormal Pap Smear  . Exposure to STD    Pt has Cervical dysplasia and HSV infection on their problem list.   HPI  Patient reports she is here for pap, physical exam. She uses paragard for [facility], placed ~2018 per pt.   Received tx for trich and BV 3 wks ago, vaginal dc has improved, though she requests pH check today.   Has hx of HSV, last outbreak 3 mo ago. Gets a couple outbreaks per year, unable to quantify. Requests symptomatic tx for future outbreaks sent to pharmacy today.     Patient's last menstrual period was 04/30/2020 (exact date). Last sex: June BCM: paragard Pt desires EC? n/a  Last pap per pt/review of record: 04/2015= LSIL, Bx on 09/2015 = CIN 1 w/HPV effect. Advised 1 yr f/u, pt didn't go Last HIV test per pt/review of record: 9/13 Last tetanus vaccine: 2015 Covid vaccine: got pfizer  Last breast exam: been awhile Personal/family hx breast cancer? PGM in her 30's  Patient reports 1 partner(s) in last year. Do they desire STI screening (if no, why not)? No, recently obtained  Does the patient desire a pregnancy in the next year? no   29 y.o., Body mass index is 22.07 kg/m. - Is patient eligible for HA1C diabetes screening based on BMI and age >70?  no  HCV screening;       Has patient been screened once for HCV in the past?  no  No results found for: HCVAB      Does the patient have current drug use, have a partner with drug use, and/or has been incarcerated since last result? no If yes--  Screen for HCV through [facility]   Does the patient meet criteria for HBV testing? no Criteria:  -Household, sexual or needle sharing contact with HBV -History of drug use -HIV positive -Those with known Hep C  PHQ-2 score 0  See flowsheet for other program required questions.   Health Maintenance Due  Topic Date Due  . Hepatitis C Screening  Never done  . HIV Screening  Never done  . PAP SMEAR-Modifier  Never done  . INFLUENZA VACCINE  Never done    ROS 10 point review of systems is otherwise negative except as mentioned in HPI and listed below: Anemia, easy bruising: not taking iron pills but gets in food Lung disease: asthma Migraines: does not see PCP. Used to take caffeine medication in [place] Blurry vision: w/migraines Vaginal d/c: recently tx for trich and BV Wt loss: d/t stress Rectal bleeding: d/t constipation per pt Rash: dermatitis in the summer   The following portions of the patient's history were reviewed and updated as appropriate: allergies, current medications, past family history, past medical history, past social history, past surgical history and problem list. Problem list updated.   See flowsheet for other program required questions.  Objective:   Vitals:   05/24/20 1055  BP: 100/63  Weight: 128 lb 9.6 oz (58.3 kg)  Height: 5\' 4"  (1.626 m)  Physical Exam Vitals and nursing note reviewed.  Constitutional:      Appearance: Normal appearance.  HENT:     Head: Normocephalic and atraumatic.     Mouth/Throat:     Mouth: Mucous membranes are moist.     Pharynx: Oropharynx is clear. No oropharyngeal exudate or posterior oropharyngeal erythema.  Eyes:     Conjunctiva/sclera: Conjunctivae normal.  Neck:     Thyroid: No thyroid mass, thyromegaly or thyroid tenderness.  Cardiovascular:     Rate and Rhythm: Normal rate and regular rhythm.     Pulses: Normal pulses.     Heart sounds: Normal heart sounds.  Pulmonary:     Effort: Pulmonary  effort is normal.     Breath sounds: Normal breath sounds.  Chest:     Breasts:        Right: Normal. No swelling, mass, nipple discharge, skin change or tenderness.        Left: Normal. No swelling, mass, nipple discharge, skin change or tenderness.  Abdominal:     General: Abdomen is flat.     Palpations: There is no mass.     Tenderness: There is no abdominal tenderness. There is no rebound.  Genitourinary:    General: Normal vulva.     Exam position: Lithotomy position.     Pubic Area: No rash or pubic lice.      Labia:        Right: No rash or lesion.        Left: No rash or lesion.      Vagina: Normal. No vaginal erythema, bleeding or lesions. Vaginal discharge: white, ph<4.5    Cervix: No cervical motion tenderness, discharge, friability, lesion or erythema. +IUD strings visulaized    Uterus: Normal.      Adnexa: Right adnexa normal and left adnexa normal.     Rectum: Normal.  Lymphadenopathy:     Head:     Right side of head: No preauricular or posterior auricular adenopathy.     Left side of head: No preauricular or posterior auricular adenopathy.     Cervical: No cervical adenopathy.     Upper Body:     Right upper body: No supraclavicular or axillary adenopathy.     Left upper body: No supraclavicular or axillary adenopathy.     Lower Body: No right inguinal adenopathy. No left inguinal adenopathy.  Skin:    General: Skin is warm and dry.     Findings: No rash.  Neurological:     Mental Status: She is alert and oriented to person, place, and time.      Assessment and Plan:  Brittany Cardenas is a 29 y.o. female presenting to the [facility] Department for an initial well woman exam/family planning visit.  Contraception counseling: Reviewed all forms of birth control options in the tiered based approach. available including abstinence; over the counter/barrier methods; hormonal contraceptive medication including pill, patch, ring,  injection,contraceptive implant, ECP; hormonal and nonhormonal IUDs; permanent sterilization options including vasectomy and the various tubal sterilization modalities. Risks, benefits, and typical effectiveness rates were reviewed.  Questions were answered.  Written information was also given to the patient to review.  Patient desires continue paragard. She will follow up in  1 year for surveillance.  She was told to call with any further questions, or with any concerns about this method of contraception.  Emphasized use of condoms 100% of the time for STI prevention.  Emergency Contraception: n/a- paragard  1.  Well woman exam -BCM: paragard, placed ~2018 per pt (unable to get records from [facility] or [place] today per RN) -Pap: done today -CBE: done today. "Active FYIs" info up to date. Recommended screening mammograms beginning at age 6 d/t PGM w/breast cancer age 83's or 19's per pt. Advised if any concerning masses to RTC asap for BCCCP referral. -STI screening: done 2 wks ago, pt declines again today -PHQ-2 score 0 -Hepatitis B/C screening: pt does not qualify -Recommended PCP f/u for migraines, asthma, anemia prn. No acute complaints today - IGP, Aptima HPV  2. HSV infection -Pt requests rx for future symptomatic tx sent to pharmacy. Done today. - acyclovir (ZOVIRAX) 400 MG tablet; Take 1 tablet (400 mg total) by mouth 3 (three) times daily for 5 days.  Dispense: 15 tablet; Refill: 3   Return in about 1 year (around 05/24/2021) for yearly wellness exam.  No future appointments.  Ann Held, PA-C

## 2020-05-30 LAB — IGP, APTIMA HPV
HPV Aptima: POSITIVE — AB
PAP Smear Comment: 0

## 2020-06-21 ENCOUNTER — Other Ambulatory Visit: Payer: Self-pay

## 2020-06-21 ENCOUNTER — Ambulatory Visit: Payer: Self-pay | Admitting: Pharmacy Technician

## 2020-06-21 DIAGNOSIS — Z79899 Other long term (current) drug therapy: Secondary | ICD-10-CM

## 2020-06-21 NOTE — Progress Notes (Signed)
Patient speaks Spanish.  Interpretation provided by Claris Pong, [phone] Interpreters.  Completed [facility] application and contract.  Patient agreed to all terms of the [facility] contract.    Patient approved to receive medication assistance at [facility] until time for re-certification in 6681, and as long as eligibility criteria continues to be met.    Provided patient with community resource material based on her particular needs.    [facility]

## 2020-06-25 ENCOUNTER — Ambulatory Visit: Payer: Self-pay | Admitting: Gerontology

## 2020-06-26 ENCOUNTER — Ambulatory Visit: Payer: Self-pay | Attending: Oncology

## 2020-06-26 ENCOUNTER — Other Ambulatory Visit: Payer: Self-pay

## 2020-06-26 DIAGNOSIS — R87610 Atypical squamous cells of undetermined significance on cytologic smear of cervix (ASC-US): Secondary | ICD-10-CM

## 2020-06-26 DIAGNOSIS — R87810 Cervical high risk human papillomavirus (HPV) DNA test positive: Secondary | ICD-10-CM

## 2020-06-26 NOTE — Progress Notes (Signed)
Patient pre-screened for BCCCP eligibility . Two patient identifiers used for verification that I was speaking to correct patient.  Patient to Present directly to [facility] for Colposcopy.

## 2020-07-02 ENCOUNTER — Ambulatory Visit (INDEPENDENT_AMBULATORY_CARE_PROVIDER_SITE_OTHER): Payer: Self-pay | Admitting: Obstetrics and Gynecology

## 2020-07-02 ENCOUNTER — Other Ambulatory Visit: Payer: Self-pay

## 2020-07-02 ENCOUNTER — Encounter: Payer: Self-pay | Admitting: Obstetrics and Gynecology

## 2020-07-02 ENCOUNTER — Other Ambulatory Visit: Payer: Self-pay | Admitting: Obstetrics and Gynecology

## 2020-07-02 VITALS — BP 117/73 | HR 81 | Ht 64.0 in | Wt 132.4 lb

## 2020-07-02 DIAGNOSIS — R87610 Atypical squamous cells of undetermined significance on cytologic smear of cervix (ASC-US): Secondary | ICD-10-CM

## 2020-07-02 DIAGNOSIS — N72 Inflammatory disease of cervix uteri: Secondary | ICD-10-CM

## 2020-07-02 DIAGNOSIS — B977 Papillomavirus as the cause of diseases classified elsewhere: Secondary | ICD-10-CM

## 2020-07-02 NOTE — Progress Notes (Signed)
Referring Provider:  BCCCP  HPI:  Brittany Cardenas is a 29 y.o.  G2P1011  who presents today for evaluation and management of abnormal cervical cytology.    Dysplasia History:   ASCUS - Pos HPV  ROS:  Pertinent items noted in HPI and remainder of comprehensive ROS otherwise negative.       She has an IUD for birth control. (ParaGard) -states that she occasionally has pelvic pain bilaterally.  She also says her periods are longer and heavier.  She does claim to be allergic to all forms of " hormone"  OB History  Gravida Para Term Preterm AB Living  2 1 1  0 1 1  SAB TAB Ectopic Multiple Live Births  1            # Outcome Date GA Lbr Len/2nd Weight Sex Delivery Anes PTL Lv  2 SAB           1 Term             Past Medical History:  Diagnosis Date  . Asthma    Patient reported  . Constipation   . Herpes simplex virus (HSV) infection    Patient reported  . Migraine   . Ovarian cyst 2012   per patient report    History reviewed. No pertinent surgical history.  SOCIAL HISTORY: Social History   Substance and Sexual Activity  Alcohol Use Never   Social History   Substance and Sexual Activity  Drug Use Never     Family History  Problem Relation Age of Onset  . Ovarian cancer Paternal Grandmother   . Breast cancer Paternal Grandmother 30  . Cancer Paternal Grandmother        ovarian cancer  . Cancer Maternal Grandfather        kidney cancer  . Hypertension Mother   . Asthma Mother     ALLERGIES:  Penicillins, Pollen extract, Shellfish allergy, and Latex  She has a current medication list which includes the following prescription(s): copper.  Physical Exam: -Vitals:  BP 117/73   Pulse 81   Ht 5\' 4"  (1.626 m)   Wt 132 lb 6.4 oz (60.1 kg)   LMP 06/10/2020   BMI 22.73 kg/m   PROCEDURE: Colposcopy performed with 4% acetic acid after verbal consent obtained                           -Aceto-white Lesions Location(s): 4 and 2 o'clock.               -Biopsy performed at 4 and 2 o'clock               -ECC indicated and performed: No.     -Biopsy sites made hemostatic with pressure and Monsel's solution   -Satisfactory colposcopy: Yes.      -Evidence of Invasive cervical CA :  NO  ASSESSMENT:  Brittany Cardenas is a 29 y.o. G2P1011 here for  1. Atypical squamous cells of undetermined significance on cytologic smear of cervix (ASC-US)   2. High risk human papilloma virus (HPV) infection of cervix   3.  IUD strings noted -long  (discussed with patient)  PLAN: 1.  I discussed the grading system of pap smears and HPV high risk viral types.  We will discuss management after colpo results return.  No orders of the defined types were placed in this encounter.  F/U  Return in about 2 weeks (around 07/16/2020) for Colpo f/u.  Brennan Bailey ,MD 07/02/2020,11:40 AM

## 2020-07-04 LAB — ANATOMIC PATHOLOGY REPORT

## 2020-07-24 ENCOUNTER — Other Ambulatory Visit: Payer: Self-pay

## 2020-07-24 ENCOUNTER — Encounter: Payer: Self-pay | Admitting: Obstetrics and Gynecology

## 2020-07-24 ENCOUNTER — Ambulatory Visit (INDEPENDENT_AMBULATORY_CARE_PROVIDER_SITE_OTHER): Payer: Self-pay | Admitting: Obstetrics and Gynecology

## 2020-07-24 VITALS — BP 116/68 | HR 67 | Ht 64.0 in | Wt 139.0 lb

## 2020-07-24 DIAGNOSIS — R87610 Atypical squamous cells of undetermined significance on cytologic smear of cervix (ASC-US): Secondary | ICD-10-CM

## 2020-07-24 DIAGNOSIS — N72 Inflammatory disease of cervix uteri: Secondary | ICD-10-CM

## 2020-07-24 DIAGNOSIS — B977 Papillomavirus as the cause of diseases classified elsewhere: Secondary | ICD-10-CM

## 2020-07-24 NOTE — Progress Notes (Signed)
HPI:      [name] is a 29 y.o. G2P1011 who LMP was No LMP recorded.  Subjective:   She presents today for follow-up of her colposcopy.  Her previous Pap smear showed ASCUS with positive HPV.    Hx: The following portions of the patient's history were reviewed and updated as appropriate:             She  has a past medical history of Asthma, Constipation, Herpes simplex virus (HSV) infection, Migraine, and Ovarian cyst (2012). She does not have any pertinent problems on file. She  has no past surgical history on file. Her family history includes Asthma in her mother; Breast cancer (age of onset: 19) in her paternal grandmother; Cancer in her maternal grandfather and paternal grandmother; Hypertension in her mother; Ovarian cancer in her paternal grandmother. She  reports that she has never smoked. She has never used smokeless tobacco. She reports that she does not drink alcohol and does not use drugs. She has a current medication list which includes the following prescription(s): copper. She is allergic to penicillins, pollen extract, shellfish allergy, and latex.       Review of Systems:  Review of Systems  Constitutional: Denied constitutional symptoms, night sweats, recent illness, fatigue, fever, insomnia and weight loss.  Eyes: Denied eye symptoms, eye pain, photophobia, vision change and visual disturbance.  Ears/Nose/Throat/Neck: Denied ear, nose, throat or neck symptoms, hearing loss, nasal discharge, sinus congestion and sore throat.  Cardiovascular: Denied cardiovascular symptoms, arrhythmia, chest pain/pressure, edema, exercise intolerance, orthopnea and palpitations.  Respiratory: Denied pulmonary symptoms, asthma, pleuritic pain, productive sputum, cough, dyspnea and wheezing.  Gastrointestinal: Denied, gastro-esophageal reflux, melena, nausea and vomiting.  Genitourinary: Denied genitourinary symptoms including symptomatic vaginal discharge, pelvic  relaxation issues, and urinary complaints.  Musculoskeletal: Denied musculoskeletal symptoms, stiffness, swelling, muscle weakness and myalgia.  Dermatologic: Denied dermatology symptoms, rash and scar.  Neurologic: Denied neurology symptoms, dizziness, headache, neck pain and syncope.  Psychiatric: Denied psychiatric symptoms, anxiety and depression.  Endocrine: Denied endocrine symptoms including hot flashes and night sweats.   Meds:   Current Outpatient Medications on File Prior to Visit  Medication Sig Dispense Refill  . PARAGARD INTRAUTERINE COPPER IU by Intrauterine route. Placed ~2018 per pt.     No current facility-administered medications on file prior to visit.          Objective:     Vitals:   07/24/20 0946  BP: 116/68  Pulse: 67   Filed Weights   07/24/20 0946  Weight: 139 lb (63 kg)              Colposcopically directed biopsies showed no abnormality.  Assessment:    G2P1011 Patient Active Problem List   Diagnosis Date Noted  . HSV infection 05/24/2020  . Cervical dysplasia 10/11/2015     1. Atypical squamous cells of undetermined significance on cytologic smear of cervix (ASC-US)   2. High risk human papilloma virus (HPV) infection of cervix     No abnormalities noted at colposcopically directed biopsies.  Minimal cervical changes noted at colposcopy.   Plan:            1.  Recommend follow-up Pap cotest 1 year.  Based future cervical follow-up on this Pap. Orders No orders of the defined types were placed in this encounter.   No orders of the defined types were placed in this encounter.     F/U  No follow-ups on file. I spent 21  minutes involved in the care of this patient preparing to see the patient by obtaining and reviewing her medical history (including labs, imaging tests and prior procedures), documenting clinical information in the electronic health record (EHR), counseling and coordinating care plans, writing and sending  prescriptions, ordering tests or procedures and directly communicating with the patient by discussing pertinent items from her history and physical exam as well as detailing my assessment and plan as noted above so that she has an informed understanding.  All of her questions were answered.  Elonda Husky, M.D. 07/24/2020 10:04 AM

## 2020-08-05 NOTE — Progress Notes (Unsigned)
Per 08/02/2020 staff message by Samara Snide, PA: She agrees with Dr. Logan Bores plan of care to repeat PAP in 1 year (06/2021).  Patient had a Colpo f/u appointment on 07/24/2020 (see note). Hart Carwin, RN

## 2020-09-13 ENCOUNTER — Ambulatory Visit: Payer: Self-pay | Admitting: Pharmacist

## 2020-09-13 ENCOUNTER — Other Ambulatory Visit: Payer: Self-pay

## 2020-09-13 DIAGNOSIS — Z79899 Other long term (current) drug therapy: Secondary | ICD-10-CM

## 2020-09-13 NOTE — Progress Notes (Signed)
[facility] Visit Note  Patient: Brittany Cardenas MRN: [id number] Date of Birth: 1990/11/01 PCP: Brittany Flake, MD   Brittany Cardenas 30 y.o. female presents for a telephone visit for medication management today. Verified patient with two identifiers. Utilized spanish interpreter services [phone], Brittany Cardenas).  There were no vitals taken for this visit.  Patient Information   Past Medical History:  Diagnosis Date  . Allergy   . Asthma    Patient reported  . Constipation   . Herpes simplex virus (HSV) infection    Patient reported  . Migraine   . Ovarian cyst 2012   per patient report     History reviewed. No pertinent surgical history.   Family History  Problem Relation Age of Onset  . Ovarian cancer Paternal Grandmother   . Breast cancer Paternal Grandmother 30  . Cancer Paternal Grandmother        ovarian cancer  . Cancer Maternal Grandfather        kidney cancer  . Hypertension Mother   . Asthma Mother   . Stroke Mother     New Diagnoses (since last visit): N/A  Family Support: Good  Lifestyle Diet: Breakfast: usually skips because her stomach use to hurt if she ate too early in the morning so she got use to not eating breakfast Lunch: veggies and protein, beans  Dinner: same as lunch or sometime something light  Drinks: water and OJ    Current Exercise Habits: Structured exercise class, Type of exercise: treadmill;strength training/weights, Time (Minutes): 60, Frequency (Times/Week): 5, Weekly Exercise (Minutes/Week): 300, Intensity: Moderate       Social History   Substance and Sexual Activity  Alcohol Use Never      Social History   Tobacco Use  Smoking Status Never Smoker  Smokeless Tobacco Never Used      Health Maintenance  Topic Date Due  . Hepatitis C Screening  Never done  . HIV Screening  Never done  . INFLUENZA VACCINE  Never done  . COVID-19 Vaccine (3 - Booster for Pfizer series)  07/04/2020  . PAP SMEAR-Modifier  05/24/2021  . TETANUS/TDAP  05/24/2025   Health Maintenance/Date Completed  Last ED visit: within the past year  Last Visit was with OBGYN: 07/24/2020 Dental Exam: unknown Eye Exam: unknown Pelvic/PAP Exam: 06/2020 Flu Vaccine: due COVID-19 Vaccine: completed primary series; pt had COVID one month ago so holding off on booster  Outpatient Encounter Medications as of 09/13/2020  Medication Sig  . acyclovir (ZOVIRAX) 400 MG tablet Take 400 mg by mouth 3 (three) times daily. For 5 days  . acyclovir (ZOVIRAX) 800 MG tablet Take 800 mg by mouth 3 (three) times daily. For outbreaks  . aspirin-acetaminophen-caffeine (EXCEDRIN MIGRAINE) 250-250-65 MG tablet Take 1 tablet by mouth daily as needed for headache or migraine. Usually during menses  . loratadine (CLARITIN) 10 MG tablet Take 10 mg by mouth daily.  Marland Kitchen PARAGARD INTRAUTERINE COPPER IU by Intrauterine route. Placed ~2018 per pt.   No facility-administered encounter medications on file as of 09/13/2020.     Assessment and Plan: HSV Pt states she takes acyclovir but is unsure of the strength. Called [facility] and they have 2 prescriptions on file with the neither having been filled yet but the most recent prescription written for 400mg  TID x5 days and the other written for 800mg  TID x2 days for outbreaks. Will transfer both prescriptions and see if pt knows which one she needs when she comes in  to pick up.   Migraine Pt takes OTC excedrin for her migraines which usually only occur during her menses. Pt states the excedrin is working well for her when she does have a migraine. Continue current regimen.   Allergies  Pt takes loratadine daily for her allergies and states it is working well for her. Continue current regimen.   Access/Adherence Pt states she is adherent to her medications and requests her acyclovir to be transferred to [facility] pharmacy. Will call [facility] to transfer her prescription. Pt also  inquired about HPV vaccine. Informed her we do not have that through [facility] but gave her alternative resources to check for availability and pricing Management consultant and [place]).    Brittany Cardenas, PharmD Pharmacy Resident  09/13/2020 9:14 AM

## 2020-09-25 ENCOUNTER — Encounter: Payer: Self-pay | Admitting: Gerontology

## 2020-09-25 ENCOUNTER — Other Ambulatory Visit: Payer: Self-pay | Admitting: Gerontology

## 2020-09-25 ENCOUNTER — Other Ambulatory Visit: Payer: Self-pay

## 2020-09-25 ENCOUNTER — Ambulatory Visit: Payer: Self-pay | Admitting: Gerontology

## 2020-09-25 VITALS — BP 116/72 | HR 78 | Temp 97.4°F | Resp 16 | Wt 143.6 lb

## 2020-09-25 DIAGNOSIS — B009 Herpesviral infection, unspecified: Secondary | ICD-10-CM

## 2020-09-25 DIAGNOSIS — Z8669 Personal history of other diseases of the nervous system and sense organs: Secondary | ICD-10-CM

## 2020-09-25 DIAGNOSIS — Z8709 Personal history of other diseases of the respiratory system: Secondary | ICD-10-CM | POA: Insufficient documentation

## 2020-09-25 DIAGNOSIS — Z7689 Persons encountering health services in other specified circumstances: Secondary | ICD-10-CM | POA: Insufficient documentation

## 2020-09-25 DIAGNOSIS — Z8719 Personal history of other diseases of the digestive system: Secondary | ICD-10-CM

## 2020-09-25 MED ORDER — SUMATRIPTAN SUCCINATE 50 MG PO TABS: 50.0000 mg | ORAL_TABLET | Freq: Once | ORAL | 0 refills | Status: DC

## 2020-09-25 MED ORDER — ALBUTEROL SULFATE HFA 108 (90 BASE) MCG/ACT IN AERS: 2.0000 | INHALATION_SPRAY | Freq: Four times a day (QID) | RESPIRATORY_TRACT | 3 refills | Status: DC | PRN
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
## 2020-09-25 NOTE — Patient Instructions (Signed)
Estreimiento, en adultos Constipation, Adult El estreimiento se produce cuando una persona tiene problemas para defecar (hacer sus deposiciones). Cuando tiene esta afeccin, el nmero de deposiciones es inferior a 3 veces por semana. Las deposiciones (heces) tambin pueden ser secas, duras o ms voluminosas de lo normal. Siga estas instrucciones en su casa: Comida y bebida  Consuma alimentos con alto contenido de fibra, por ejemplo: ? Frutas y verduras frescas. ? Cereales integrales. ? Frijoles.  Consuma menos alimentos bajos en fibra y con alto contenido de grasas y azcar, como: ? Papas fritas. ? Hamburguesas. ? Galletas dulces. ? Caramelos. ? Gaseosas.  Beba suficiente lquido para mantener el pis (orina) de color amarillo plido.   Instrucciones generales  Haga actividad fsica con regularidad o segn las indicaciones del mdico. Intente practicar 150minutos de actividad fsica por semana.  Vaya al bao cuando sienta la necesidad de defecar. No se aguante las ganas.  Use los medicamentos de venta libre y los recetados solamente como se lo haya indicado el mdico. Estos incluyen los suplementos de fibra.  Cuando est defecando: ? Respire profundamente mientras relaja la parte inferior del vientre (abdomen). ? Relaje el suelo plvico. El suelo plvico est formado por un grupo de msculos que sostienen el recto, la vejiga y los intestinos (as como el tero en las mujeres).  Controle su afeccin para detectar cualquier cambio. Visite al mdico si advierte lo indicado.  Concurra a todas las visitas de seguimiento como se lo haya indicado el mdico. Esto es importante. Comunquese con un mdico si:  Su dolor empeora.  Tiene fiebre.  No ha defecado por 4das.  Vomita.  No tiene hambre.  Pierde peso.  Tiene sangrado por la abertura entre las nalgas (ano).  Las deposiciones son delgadas como un lpiz. Solicite ayuda de inmediato si:  Tiene fiebre, y los sntomas  empeoran de repente.  Tiene prdida de materia fecal u observa sangre en las heces.  Siente el vientre ms duro o ms grande de lo normal (hinchado).  Siente un dolor muy intenso en el vientre.  Se siente mareado o se desmaya. Resumen  El estreimiento se produce cuando una persona defeca menos de 3 veces a la semana, tiene problemas para defecar o las heces son secas, duras o ms grandes que lo normal.  Consuma alimentos con un alto contenido de fibra.  Beba suficiente lquido para mantener el pis (orina) de color amarillo plido.  Use los medicamentos de venta libre y los recetados solamente como se lo haya indicado el mdico. Estos incluyen los suplementos de fibra. Esta informacin no tiene como fin reemplazar el consejo del mdico. Asegrese de hacerle al mdico cualquier pregunta que tenga. Document Revised: 09/15/2019 Document Reviewed: 09/15/2019 Elsevier Patient Education  2021 Elsevier Inc.  

## 2020-09-26 LAB — COMPREHENSIVE METABOLIC PANEL
ALT: 16 IU/L (ref 0–32)
AST: 19 IU/L (ref 0–40)
Albumin/Globulin Ratio: 1.4 (ref 1.2–2.2)
Albumin: 4.1 g/dL (ref 3.9–5.0)
Alkaline Phosphatase: 76 IU/L (ref 44–121)
BUN/Creatinine Ratio: 19 (ref 9–23)
BUN: 13 mg/dL (ref 6–20)
Bilirubin Total: <0.2 mg/dL (ref 0.0–1.2)
CO2: 24 mmol/L (ref 20–29)
Calcium: 9.1 mg/dL (ref 8.7–10.2)
Chloride: 104 mmol/L (ref 96–106)
Creatinine, Ser: 0.67 mg/dL (ref 0.57–1.00)
GFR calc Af Amer: 136 mL/min/{1.73_m2} (ref >59)
GFR calc non Af Amer: 118 mL/min/{1.73_m2} (ref >59)
Globulin, Total: 2.9 g/dL (ref 1.5–4.5)
Glucose: 74 mg/dL (ref 65–99)
Potassium: 4.1 mmol/L (ref 3.5–5.2)
Sodium: 143 mmol/L (ref 134–144)
Total Protein: 7 g/dL (ref 6.0–8.5)

## 2020-09-26 LAB — CBC WITH DIFFERENTIAL/PLATELET
Basophils Absolute: 0 x10E3/uL (ref 0.0–0.2)
Basos: 1 %
EOS (ABSOLUTE): 0.5 x10E3/uL — ABNORMAL HIGH (ref 0.0–0.4)
Eos: 8 %
Hematocrit: 39.7 % (ref 34.0–46.6)
Hemoglobin: 12.6 g/dL (ref 11.1–15.9)
Immature Grans (Abs): 0 x10E3/uL (ref 0.0–0.1)
Immature Granulocytes: 1 %
Lymphocytes Absolute: 2 x10E3/uL (ref 0.7–3.1)
Lymphs: 34 %
MCH: 29 pg (ref 26.6–33.0)
MCHC: 31.7 g/dL (ref 31.5–35.7)
MCV: 92 fL (ref 79–97)
Monocytes Absolute: 0.5 x10E3/uL (ref 0.1–0.9)
Monocytes: 9 %
Neutrophils Absolute: 2.9 x10E3/uL (ref 1.4–7.0)
Neutrophils: 47 %
Platelets: 265 x10E3/uL (ref 150–450)
RBC: 4.34 x10E6/uL (ref 3.77–5.28)
RDW: 12.3 % (ref 11.7–15.4)
WBC: 6 x10E3/uL (ref 3.4–10.8)

## 2020-09-26 LAB — LIPID PANEL
Chol/HDL Ratio: 2.4 ratio (ref 0.0–4.4)
Cholesterol, Total: 128 mg/dL (ref 100–199)
HDL: 54 mg/dL (ref >39)
LDL Chol Calc (NIH): 59 mg/dL (ref 0–99)
Triglycerides: 72 mg/dL (ref 0–149)
VLDL Cholesterol Cal: 15 mg/dL (ref 5–40)

## 2020-09-26 LAB — URINALYSIS
Bilirubin, UA: NEGATIVE
Glucose, UA: NEGATIVE
Ketones, UA: NEGATIVE
Leukocytes,UA: NEGATIVE
Nitrite, UA: NEGATIVE
Protein,UA: NEGATIVE
RBC, UA: NEGATIVE
Specific Gravity, UA: 1.011 (ref 1.005–1.030)
Urobilinogen, Ur: 0.2 mg/dL (ref 0.2–1.0)
pH, UA: 5 (ref 5.0–7.5)

## 2020-09-26 LAB — TSH: TSH: 2.7 u[IU]/mL (ref 0.450–4.500)

## 2020-09-30 ENCOUNTER — Telehealth: Payer: Self-pay | Admitting: Pharmacist

## 2020-09-30 NOTE — Telephone Encounter (Signed)
09/30/2020 10:49:52 AM - ProAir HFA to patient & provider  -- Rhetta Mura - Monday, September 30, 2020 10:48 AM --Received pharmacy printout for ProAir HFA Inhale 2 puffs into the lungs every 6 hours as needed for wheezing or shortness of breath for Albuterol. Mailing to patient to sign & return and provider portion in [facility] folder.

## 2020-10-04 ENCOUNTER — Telehealth: Payer: Self-pay | Admitting: Pharmacist

## 2020-10-04 NOTE — Telephone Encounter (Signed)
10/04/2020 10:36:05 AM - ProAir Pending  -- Brittany Cardenas - Friday, October 04, 2020 10:35 AM --Received the provider signed portion of [employer] application for ProAir--holding for patient to return her portion--Mailed to patient 09/30/2020.

## 2020-10-13 NOTE — Progress Notes (Signed)
Per Dr. Logan Bores, patient to have pap in one year.  Hart Carwin at [facility] aware.

## 2020-10-22 ENCOUNTER — Encounter: Payer: Self-pay | Admitting: Gerontology

## 2020-10-22 ENCOUNTER — Ambulatory Visit: Payer: Self-pay | Admitting: Gerontology

## 2020-10-22 ENCOUNTER — Other Ambulatory Visit: Payer: Self-pay

## 2020-10-22 VITALS — BP 113/75 | Temp 98.1°F | Resp 16 | Wt 144.0 lb

## 2020-10-22 DIAGNOSIS — R002 Palpitations: Secondary | ICD-10-CM | POA: Insufficient documentation

## 2020-10-22 DIAGNOSIS — R009 Unspecified abnormalities of heart beat: Secondary | ICD-10-CM | POA: Insufficient documentation

## 2020-10-22 DIAGNOSIS — R030 Elevated blood-pressure reading, without diagnosis of hypertension: Secondary | ICD-10-CM

## 2020-10-22 DIAGNOSIS — B009 Herpesviral infection, unspecified: Secondary | ICD-10-CM
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
## 2020-11-04 ENCOUNTER — Telehealth: Payer: Self-pay | Admitting: Licensed Clinical Social Worker

## 2020-11-04 NOTE — Telephone Encounter (Signed)
Patient left vm requesting to schedule appointment on 10/31/20.

## 2020-11-20 ENCOUNTER — Ambulatory Visit: Payer: Self-pay | Admitting: Licensed Clinical Social Worker

## 2020-11-20 ENCOUNTER — Encounter: Payer: Self-pay | Admitting: Licensed Clinical Social Worker

## 2020-11-20 DIAGNOSIS — F4323 Adjustment disorder with mixed anxiety and depressed mood: Secondary | ICD-10-CM

## 2020-11-20 NOTE — Progress Notes (Signed)
Counselor/Therapist Progress Note  Patient ID: Brittany Cardenas, MRN: [id number],    Date: 11/20/2020  Time Spent: 1 hour    Treatment Type: Psychotherapy  Reported Symptoms: significant anxiety symptoms, panic symptoms  Mental Status Exam:  Appearance:   Casual, Neat and Well Groomed     Behavior:  Appropriate and Sharing  Motor:  Normal  Speech/Language:   Clear and Coherent and Normal Rate  Affect:  Appropriate and Congruent  Mood:  normal  Thought process:  goal directed  Thought content:    WNL  Sensory/Perceptual disturbances:    WNL  Orientation:  oriented to person, place, time/date, situation and day of week  Attention:  Good  Concentration:  Good  Memory:  WNL  Fund of knowledge:   Good  Insight:    Good  Judgment:   Good  Impulse Control:  Good   Risk Assessment: Danger to Self:  No Self-injurious Behavior: No Danger to Others: No Duty to Warn:no Physical Aggression / Violence:No  Access to Firearms a concern: No  Gang Involvement:No   Subjective: Patient presents with excessive stress due to child being placed in foster care/ involvement in DSS/CPS case for many months. She reports that she recently almost passed out in court due to the stress. According to medical records this experience was likely due to anxiety. Patient reports it was due to extreme stress. Patient has supervised weekly visits with the child each Monday for 1.5 hours.  Patient reports that overall she is able to manage mild symptoms of low mood and anxiety due to the present situation, but when she is in court she experiences extreme emotional distress. Patient shares that she uses the following strategies to mange her stress/anxiety and emotional wellbeing- Workout, engage in spiritual/faith based practices, including attending church weekly and praying, paints, and meditates.     GAD 7 : Generalized Anxiety Score 11/20/2020 05/23/2020  Nervous, Anxious, on Edge 1 0  Control/stop  worrying 0 0  Worry too much - different things 1 0  Trouble relaxing 1 0  Restless 0 0  Easily annoyed or irritable 0 0  Afraid - awful might happen 0 0  Total GAD 7 Score 3 0  Anxiety Difficulty Not difficult at all Not difficult at all   Interventions: Brief assessment and treatment planning; CBT  Checked in with patient and assessed current psychosocial stressors.  Provided supportive space encouraging emotional release and processing of current psychosocial stressors, validated patient's feelings of worry and sadness/distress due to custody issues with her child. Explored patient's current coping strategies. Identified patient's goal of treatment and worked collaboratively to develop treatment plan. Taught patient  About trauma response and discussed options to activate the senses to manage emotions during court and during difficult system interactions. LCSW and patient discussed LCSW providing a letter to patient for her use. Provided support through active listening, validation of feelings, and highlighted patient's strengths.   Diagnosis:   ICD-10-CM   1. Adjustment disorder with mixed anxiety and depressed mood  F43.23    Plan: Patient's goal is: develop coping skills to manage stress/distress/anxiety/emotions while in court and when dealing with issues around the CPS case.  Treatment Target: Reducing vulnerability to "emotional mind"/ Increase coping skills  Self-care - nutrition, sleep, exercise   Attention training - use of mindfulness   Mindfulness practices   Deep breathing   Grounding techniques as necessary   STOP technique  Distractions   Future Appointments  Date Time Provider Department  Center  11/27/2020  1:00 PM Kathreen Cosier, LCSW [facility] None  01/22/2021 10:30 AM [place], Francoise Ceo, NP [facility] None    Kathreen Cosier, LCSW

## 2020-11-20 NOTE — Progress Notes (Deleted)
  Brittany Cardenas 10-Mar-1991   Patient presents with excessive stress due to child being placed in foster care/ involvement in DSS/CPS case for many months. She reports that she recently almost passed out in court due to the stress. According to medical records this experience was likely due to anxiety. Patient reports it was due to extreme stress. Patient has supervised weekly visits with the child each Monday for 1.5 hours.  Patient reports that overall she is able to manage mild symptoms of low mood and anxiety due to the present situation, but when she is in court she experiences extreme emotional distress. Patient shares that she uses the following strategies to mange her stress/anxiety and emotional wellbeing- Workout, engage in spiritual/faith based practices, including attending church weekly and praying, paints, and meditates.      GAD 7 : Generalized Anxiety Score 11/20/2020 05/23/2020  Nervous, Anxious, on Edge 1 0  Control/stop worrying 0 0  Worry too much - different things 1 0  Trouble relaxing 1 0  Restless 0 0  Easily annoyed or irritable 0 0  Afraid - awful might happen 0 0  Total GAD 7 Score 3 0  Anxiety Difficulty Not difficult at all Not difficult at all    Patient's goal is: develop coping skills to manage stress/distress/anxiety/emotions while in court and when dealing with issues around the CPS case.  Treatment Target: Reducing vulnerability to "emotional mind"/ Increase coping skills - Self-care - nutrition, sleep, exercise  - Attention training - use of mindfulness  - Mindfulness practices  - Deep breathing  - Grounding techniques as necessary  - STOP technique - Distractions   Future Appointments  Date Time Provider Department Center  11/27/2020  1:00 PM Kathreen Cosier, LCSW [facility] None  01/22/2021 10:30 AM [place], Francoise Ceo, NP [facility] None    Kathreen Cosier, MSW, LCSW [facility]

## 2020-11-27 ENCOUNTER — Ambulatory Visit: Payer: Self-pay | Admitting: Licensed Clinical Social Worker

## 2020-11-27 DIAGNOSIS — F4323 Adjustment disorder with mixed anxiety and depressed mood: Secondary | ICD-10-CM

## 2020-11-27 NOTE — Progress Notes (Signed)
Counselor/Therapist Progress Note  Patient ID: Brittany Cardenas, MRN: [id number],    Date: 11/27/2020  Time Spent: 50 minutes    Treatment Type: Psychotherapy  Reported Symptoms: Anxiety, anxious thoughts and worries secondary to parental custody issues  Mental Status Exam:  Appearance:   Casual and Well Groomed     Behavior:  Appropriate and Sharing  Motor:  Normal  Speech/Language:   Clear and Coherent and Normal Rate  Affect:  Appropriate, Congruent and Full Range  Mood:  anxious  Thought process:  normal  Thought content:    WNL  Sensory/Perceptual disturbances:    WNL  Orientation:  oriented to person, place, time/date and situation  Attention:  Good  Concentration:  Good  Memory:  WNL  Fund of knowledge:   Good  Insight:    Good  Judgment:   Good  Impulse Control:  Good   Risk Assessment: Danger to Self:  No Self-injurious Behavior: No Danger to Others: No Duty to Warn:no Physical Aggression / Violence:No  Access to Firearms a concern: No  Gang Involvement:No   Subjective: Patient was engaged and cooperative throughout the session using time effectively to discuss thoughts and feelings.  Patient voices continued motivation for treatment and understanding of depressive symptoms anda Anxiety due to current stressors. Patient is likely to benefit from treatment because she is motivated to decrease distress and improve functioning and she reports benefit from symptoms.   Interventions: Cognitive Behavioral Therapy Checked in with patient regarding her week. Reviewed previous session regarding regarding letter provided to patient. Provided supportive space encouraging emotional release and processing of current psychosocial stressors, challenges with child custody. Validated patient's feelings of distress and identified origins of these feelings. Provided support through active listening, validation of feelings, and highlighted patient's strengths.     Diagnosis:   ICD-10-CM   1. Adjustment disorder with mixed anxiety and depressed mood  F43.23     Plan: Patient's goal is: develop coping skills to manage stress/distress/anxiety/emotions while in court and when dealing with issues around the CPS case. Treatment Target: Reducing vulnerability to "emotional mind"/Increase coping skills  Self-care -nutrition, sleep, exercise   Attention training -use of mindfulness   Mindfulness practices   Deep breathing   Grounding techniques as necessary   STOP technique  Distractions  Future Appointments  Date Time Provider Department Center  12/18/2020 10:00 AM Kathreen Cosier, LCSW [facility] None  12/25/2020 10:00 AM Kathreen Cosier, LCSW [facility] None  01/22/2021 10:30 AM Rolm Gala, NP [facility] None   Kathreen Cosier, LCSW

## 2020-12-18 ENCOUNTER — Ambulatory Visit: Payer: Self-pay | Admitting: Licensed Clinical Social Worker

## 2020-12-18 DIAGNOSIS — F4323 Adjustment disorder with mixed anxiety and depressed mood: Secondary | ICD-10-CM

## 2020-12-18 NOTE — Progress Notes (Signed)
Counselor/Therapist Progress Note  Patient ID: Brittany Cardenas, MRN: [id number],    Date: 12/18/2020  Time Spent: 40 minutes   Treatment Type: Psychotherapy  Reported Symptoms: anxiety, anxious thought secondary to custody issues   Mental Status Exam:  Appearance:   Casual and Well Groomed     Behavior:  Appropriate and Sharing  Motor:  Normal  Speech/Language:   Clear and Coherent and Normal Rate  Affect:  Appropriate, Congruent and Full Range  Mood:  normal  Thought process:  goal directed  Thought content:    WNL  Sensory/Perceptual disturbances:    WNL  Orientation:  oriented to person, place, time/date and situation  Attention:  Good  Concentration:  Good  Memory:  WNL  Fund of knowledge:   Good  Insight:    Good  Judgment:   Good  Impulse Control:  Good   Risk Assessment: Danger to Self:  No Self-injurious Behavior: No Danger to Others: No Duty to Warn:no Physical Aggression / Violence:No  Access to Firearms a concern: No  Gang Involvement:No   Subjective: Patient was actively engaged and participated throughout the session using time effectively to discuss thoughts and feelings. Patient voices continued motivation for treatment and understanding of anxiety issues related to child custody issues. Patient is likely to benefit from future treatment because she is motivated to decrease distress and reports benefit of regular sessions in addressing these symptoms.   Interventions: Cognitive Behavioral Therapy Established psychological safety. Checked in with patient regarding her week. Engaged patient in processing current psychosocial stressors, continued anxiety symptoms due to custody issues. Validated patient feelings of sadness, frustration and anger providing supportive space for patient to verbally ventilate. Encouraged patient to continue self-care- exercise and to try to increase hours of daily sleep. Provided support through active listening,  validation of feelings, and highlighted patient's strengths.   Diagnosis:   ICD-10-CM   1. Adjustment disorder with mixed anxiety and depressed mood  F43.23    Plan: Patient's goal is: develop coping skills to manage stress/distress/anxiety/emotions while in court and when dealing with issues around the CPS case. Treatment Target: Reducing vulnerability to "emotional mind"/Increase coping skills  Self-care -nutrition, sleep, exercise   Attention training -use of mindfulness   Mindfulness practices   Deep breathing   Grounding techniques as necessary   STOP technique  Distractions  Future Appointments  Date Time Provider Department Center  12/25/2020 10:00 AM Kathreen Cosier, LCSW [facility] None  01/22/2021 10:30 AM [place], Francoise Ceo, NP [facility] None    Kathreen Cosier, LCSW

## 2020-12-24 ENCOUNTER — Ambulatory Visit: Payer: Self-pay | Admitting: Gerontology

## 2020-12-25 ENCOUNTER — Ambulatory Visit: Payer: Self-pay | Admitting: Licensed Clinical Social Worker

## 2021-01-08 ENCOUNTER — Telehealth: Payer: Self-pay | Admitting: Licensed Clinical Social Worker

## 2021-01-08 NOTE — Telephone Encounter (Signed)
From [facility] "One of your patients called needing an appointment but I saw that you won't be back till the 18th and I let her know. This is patient's MRN [id number]."

## 2021-01-14 ENCOUNTER — Other Ambulatory Visit: Payer: Self-pay

## 2021-01-14 ENCOUNTER — Ambulatory Visit (LOCAL_COMMUNITY_HEALTH_CENTER): Payer: Self-pay | Admitting: Physician Assistant

## 2021-01-14 VITALS — BP 112/78 | Ht 64.0 in | Wt 144.8 lb

## 2021-01-14 DIAGNOSIS — Z3009 Encounter for other general counseling and advice on contraception: Secondary | ICD-10-CM

## 2021-01-14 DIAGNOSIS — Z30431 Encounter for routine checking of intrauterine contraceptive device: Secondary | ICD-10-CM

## 2021-01-14 DIAGNOSIS — Z299 Encounter for prophylactic measures, unspecified: Secondary | ICD-10-CM

## 2021-01-14 DIAGNOSIS — Z113 Encounter for screening for infections with a predominantly sexual mode of transmission: Secondary | ICD-10-CM

## 2021-01-14 LAB — WET PREP FOR TRICH, YEAST, CLUE
Clue Cell Exam: POSITIVE — AB
Trichomonas Exam: NEGATIVE
Yeast Exam: NEGATIVE

## 2021-01-14 MED ORDER — METRONIDAZOLE 500 MG PO TABS
500.0000 mg | ORAL_TABLET | Freq: Two times a day (BID) | ORAL | 0 refills | Status: AC
Start: 2021-01-14 — End: 2021-01-21

## 2021-01-14 NOTE — Progress Notes (Signed)
Concerns for foul discharge odor, discharge was yellow/ green per patient, abnormal bleeding. Occasional painful intercourse, concerns for excessive hair fall out.  ParaGard inserted 06/22/2016. Wants acyclovir refill.

## 2021-01-14 NOTE — Progress Notes (Signed)
Wet mount reviewed by  Providers, orders were completed. Patient tx per provider orders.

## 2021-01-15 ENCOUNTER — Ambulatory Visit: Payer: Self-pay | Admitting: Licensed Clinical Social Worker

## 2021-01-15 ENCOUNTER — Encounter: Payer: Self-pay | Admitting: Licensed Clinical Social Worker

## 2021-01-15 DIAGNOSIS — F4323 Adjustment disorder with mixed anxiety and depressed mood: Secondary | ICD-10-CM

## 2021-01-15 NOTE — Progress Notes (Signed)
Counselor/Therapist Progress Note  Patient ID: Brittany Cardenas, MRN: [id number],    Date: 01/15/2021  Time Spent: 50 minutes   Treatment Type: Psychotherapy  Reported Symptoms: low mood, sadness, anxiety, anxious thoughts and worries; secondary to child custody issues  Mental Status Exam:  Appearance:   Casual and Well Groomed     Behavior:  Appropriate and Sharing  Motor:  Normal  Speech/Language:   Blocked and Normal Rate  Affect:  Appropriate, Congruent and Full Range  Mood:  dysthymic  Thought process:  goal directed  Thought content:    WNL  Sensory/Perceptual disturbances:    WNL  Orientation:  oriented to person, place, time/date, situation and day of week  Attention:  Good  Concentration:  Good  Memory:  WNL  Fund of knowledge:   Good  Insight:    Good  Judgment:   Good  Impulse Control:  Good   Risk Assessment: Danger to Self:  No Self-injurious Behavior: No Danger to Others: No Duty to Warn:no Physical Aggression / Violence:No  Access to Firearms a concern: No  Gang Involvement:No   Subjective: Patient was engaged and cooperative throughout the session using time effectively to discuss thoughts and feelings. Patient voices continued motivation for treatment and use of coping strategies to manage anxiety, including use of stress, scents, and candies. Patient is likely to benefit from future treatment because she remains motivated to manage symptoms and reports benefit of regular sessions in addressing current symptoms.   Interventions: Cognitive Behavioral Therapy Checked in with patient. Discussed release of information and went over the records with patient.  Provided supportive space encouraging emotional release and processing of current psychosocial stressors increased distress due to child custody concerns. Validated patient's feelings of sadness and anxiety. Reviewed strategies to help manage emotions. Provided support through active listening,  validation of feelings, and highlighted patient's strengths.   Diagnosis:   ICD-10-CM   1. Adjustment disorder with mixed anxiety and depressed mood  F43.23    Plan: Patient's goal is: develop coping skills to manage stress/distress/anxiety/emotions while in court and when dealing with issues around the CPS case. Treatment Target: Reducing vulnerability to "emotional mind"/Increase coping skills  Self-care -nutrition, sleep, exercise   Attention training -use of mindfulness   Mindfulness practices   Deep breathing   Grounding techniques as necessary   STOP technique  Distractions  Future Appointments  Date Time Provider Department Center  01/22/2021 10:30 AM Eulogio Bear E, NP [facility] None  01/29/2021 11:00 AM Kathreen Cosier, LCSW [facility] None    Kathreen Cosier, LCSW

## 2021-01-15 NOTE — Progress Notes (Signed)
LCSW meet with patient today and released records to patient per patient's request: Mental Health records from 05/16/2015 - 05/10/2017 and one medical note from 05/16/2015. LCSW verified patient's ID.

## 2021-01-16 MED ORDER — ACYCLOVIR 800 MG PO TABS: 800.0000 mg | ORAL_TABLET | Freq: Every day | ORAL | 12 refills | Status: DC

## 2021-01-16 NOTE — Progress Notes (Signed)
[facility] problem [facility]  Subjective:  Brittany Cardenas is a 30 y.o. being seen today with concerns about vaginal discharge.  Chief Complaint  Patient presents with  . Contraception    Has vaginal complaints    HPI  Patient states that she has a Paragard IUD and that she has had heavy bleeding with her last period.  States that she has had moderate bleeding with a vaginal discharge and odor for 3 weeks prior to her period starting on 12/28/2020.  Patient denies change in soap/body wash, detergent and change in partner.  Reports that she did use a pineapple vinegar treatment in her vaginal area a few weeks ago.  Patient wants to make sure her IUD is in place as well.  Patient request Rx for Acyclovir today.    Does the patient have a current or past history of drug use? No   No components found for: HCV]   Health Maintenance Due  Topic Date Due  . HIV Screening  Never done  . Hepatitis C Screening  Never done  . COVID-19 Vaccine (3 - Booster for Pfizer series) 06/03/2020    Review of Systems  All other systems reviewed and are negative.   The following portions of the patient's history were reviewed and updated as appropriate: allergies, current medications, past family history, past medical history, past social history, past surgical history and problem list. Problem list updated.   See flowsheet for other program required questions.  Objective:   Vitals:   01/14/21 1044  BP: 112/78  Weight: 144 lb 12.8 oz (65.7 kg)  Height: 5\' 4"  (1.626 m)    Physical Exam Constitutional:      General: She is not in acute distress.    Appearance: Normal appearance.  HENT:     Head: Normocephalic and atraumatic.     Mouth/Throat:     Mouth: Mucous membranes are moist.     Pharynx: Oropharynx is clear. No oropharyngeal exudate or posterior oropharyngeal erythema.  Eyes:     Conjunctiva/sclera: Conjunctivae normal.   Pulmonary:     Effort: Pulmonary effort is normal.  Abdominal:     Palpations: Abdomen is soft. There is no mass.     Tenderness: There is no abdominal tenderness. There is no guarding or rebound.  Genitourinary:    General: Normal vulva.     Rectum: Normal.     Comments: External genitalia/pubic area without nits, lice, edema, erythema, lesions and inguinal adenopathy. Vagina with normal mucosa and moderate amount of thin, brownish discharge, pH=4.5. Cervix without visible lesions. IUD strings visualized and palpated during exam. Uterus firm, mobile, nt, no masses, no CMT, no adnexal tenderness or fullness. Musculoskeletal:     Cervical back: Neck supple. No tenderness.  Skin:    General: Skin is warm and dry.     Findings: No bruising, erythema, lesion or rash.  Neurological:     Mental Status: She is alert and oriented to person, place, and time.  Psychiatric:        Mood and Affect: Mood normal.        Behavior: Behavior normal.        Thought Content: Thought content normal.        Judgment: Judgment normal.       Assessment and Plan:  Brittany Cardenas is a 30 y.o. female presenting to the [facility] Department for a Women's Health problem visit  1.  Encounter for counseling regarding contraception Reviewed with patient normal SE of Paragard and when to call clinic for concerns. Enc condoms with all sex for STD protection.  2. Screening for STD (sexually transmitted disease) Await test results.  Counseled that RN will call if needs to RTC for treatment once results are back. - WET PREP FOR TRICH, YEAST, CLUE - Chlamydia/Gonorrhea [place] [facility] - HIV [place] [facility] - Syphilis Serology,  Lab  3. Surveillance of previously prescribed intrauterine contraceptive device Reassured patient that IUD appears to be in place.  4. Prophylactic measure Due to patient complaints/symptoms and exam findings will treat for likely BV with Metronidazole  500 mg #14 1 po BID for 7 days with food, no EtOH for 24 hr before and until 72 hr after completing medicine. No sex for 10 days. 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 - metroNIDAZOLE (FLAGYL) 500 MG tablet; Take 1 tablet (500 mg total) by mouth 2 (two) times daily for 7 days.  Dispense: 14 tablet; Refill: 0 - acyclovir (ZOVIRAX) 800 MG tablet; Take 1 tablet (800 mg total) by mouth daily.  Dispense: 30 tablet; Refill: 12     No follow-ups on file.  Future Appointments  Date Time Provider Department Center  01/22/2021 10:30 AM Rolm Gala, NP [facility] None  01/29/2021 11:00 AM Kathreen Cosier, LCSW [facility] None    Matt Holmes, [place]

## 2021-01-21 LAB — HM HIV SCREENING LAB: HM HIV Screening: NEGATIVE

## 2021-01-22 ENCOUNTER — Ambulatory Visit: Payer: Self-pay | Admitting: Gerontology

## 2021-01-27 ENCOUNTER — Other Ambulatory Visit: Payer: Self-pay

## 2021-01-29 ENCOUNTER — Ambulatory Visit: Payer: Self-pay | Admitting: Licensed Clinical Social Worker

## 2021-01-29 DIAGNOSIS — F4323 Adjustment disorder with mixed anxiety and depressed mood: Secondary | ICD-10-CM

## 2021-01-29 NOTE — Progress Notes (Signed)
Counselor/Therapist Progress Note  Patient ID: Brittany Cardenas, MRN: [id number],    Date: 01/29/2021  Time Spent: 50 minutes    Treatment Type: Psychotherapy  Reported Symptoms: low mood, sandess, hopelessness; anxiety, anxious thoughts; secondary to child custody issues  Mental Status Exam:  Appearance:   Casual and Well Groomed     Behavior:  Appropriate and Sharing  Motor:  Normal  Speech/Language:   Clear and Coherent and Normal Rate  Affect:  Appropriate, Congruent and Full Range  Mood:  anxious  Thought process:  normal  Thought content:    WNL  Sensory/Perceptual disturbances:    WNL  Orientation:  oriented to person, place, time/date, situation and day of week  Attention:  Good  Concentration:  Good  Memory:  WNL  Fund of knowledge:   Good  Insight:    Good  Judgment:   Good  Impulse Control:  Good   Risk Assessment: Danger to Self:  No Self-injurious Behavior: No Danger to Others: No Duty to Warn:no Physical Aggression / Violence:No  Access to Firearms a concern: No  Gang Involvement:No   Subjective: Patient was actively engaged and participated cooperatively throughout the session using time effectively to discuss thoughts, feelings and practicing mindfulness breathing. Patient voices continued motivation for treatment and understanding of depressive symptoms and anxiety related to child custody issues.   Patient is likely to benefit from future treatment because she remains motivated to decrease symptoms of distress and reports benefit of regular sessions in addressing symptoms.    Patient provided LCSW verbal consent for LCSW to speak with [facility] regarding accessing her records.   Interventions: Cognitive Behavioral Therapy and Mindfulness Meditation Checked in with patient regarding her week. Engaged patient in processing current psychosocial stressors, increased anxiety symptoms and depressive symptoms due to  child custody issues. Validated patient's feelings of anger, frustration, hopelessness, and sadness providing supportive counseling. Led patient in mindfulness exercise, processed exercise and encouraged patient to practice one min. Daily. Provided support through active listening, validation of feelings, and highlighted patient's strengths.   Diagnosis:   ICD-10-CM   1. Adjustment disorder with mixed anxiety and depressed mood  F43.23    Plan: Patient's goal is: develop coping skills to manage stress/distress/anxiety/emotions while in court and when dealing with issues around the CPS case. Treatment Target: Reducing vulnerability to "emotional mind"/Increase coping skills  Self-care -nutrition, sleep, exercise   Attention training -use of mindfulness   Mindfulness practices   Deep breathing   Grounding techniques as necessary   STOP technique  Distractions  Future Appointments  Date Time Provider Department Center  02/19/2021 11:00 AM Kathreen Cosier, LCSW [facility] None    Kathreen Cosier, LCSW

## 2021-02-03 ENCOUNTER — Telehealth: Payer: Self-pay | Admitting: Licensed Clinical Social Worker
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
## 2021-02-10 ENCOUNTER — Telehealth: Payer: Self-pay | Admitting: Licensed Clinical Social Worker

## 2021-02-10 NOTE — Telephone Encounter (Signed)
Returned patient's call from 02/07/2021. spoke with patient regarding need for records and a need for a letter. LCSW informed pt. to go to [facility] for records release and to leave paper from lawyer with what is needed for the latter. LCSW informed pt that the lawyer can call and share with LCSW what is needed LCSW just cannot release info. To the lawyer without consent. Patient is to come into the health department to request records and to drop off instructions for the letter.

## 2021-02-14 ENCOUNTER — Telehealth: Payer: Self-pay | Admitting: Licensed Clinical Social Worker

## 2021-02-14 NOTE — Telephone Encounter (Signed)
Patient contact related to need for records to be released to her.

## 2021-02-19 ENCOUNTER — Ambulatory Visit: Payer: Self-pay | Admitting: Licensed Clinical Social Worker

## 2021-02-19 DIAGNOSIS — F4323 Adjustment disorder with mixed anxiety and depressed mood: Secondary | ICD-10-CM

## 2021-02-19 NOTE — Progress Notes (Signed)
Counselor/Therapist Progress Note  Patient ID: Brittany Cardenas, MRN: [id number],    Date: 02/19/2021  Time Spent: 45 minutes    Treatment Type: Individual Therapy  Reported Symptoms:  mood stability   Mental Status Exam:  Appearance:   Casual     Behavior:  Appropriate and Sharing  Motor:  Normal  Speech/Language:   Clear and Coherent  Affect:  Appropriate, Congruent, and Full Range  Mood:  normal  Thought process:  goal directed  Thought content:    WNL  Sensory/Perceptual disturbances:    WNL  Orientation:  oriented to person, place, time/date, and situation  Attention:  Good  Concentration:  Good  Memory:  WNL  Fund of knowledge:   Good  Insight:    Good  Judgment:   Good  Impulse Control:  Good   Risk Assessment: Danger to Self:  No Self-injurious Behavior: No Danger to Others: No Duty to Warn:no Physical Aggression / Violence:No  Access to Firearms a concern: No  Gang Involvement:No   Subjective: Patient was engaged and cooperative throughout the session using time to discuss records release and letter needed. Patient voices continued motivation for treatment and understanding of mood and anxiety issues related to custody issues. Patient is likely to benefit from future treatment because she remains motivated to parent her child, decrease anxiety and improve mood and reports benefit of regular sessions in addressing these issues.    Interventions: Cognitive Behavioral Therapy Checked in with patient regarding her week. Set session agenda to focus on needed records and letter. Provided mental health records to patient for 05/23/20, 11/20/20, 11/27/20, 12/18/20, 01/15/21, and 01/29/21. Discussed needed information for letter documenting services provided, including speaking with attorney from patient's phone with patient's verbal consent -attorney provided LCSW with information needed to be included in the letter. Follow up appt. Was scheduled.    Diagnosis:   ICD-10-CM   1. Adjustment disorder with mixed anxiety and depressed mood  F43.23       Plan: Patient's goal is: develop coping skills to manage stress/distress/anxiety/emotions while in court and when dealing with issues around the co-parenting and the CPS case.  Treatment Target: Reducing vulnerability to "emotional mind"/ Increase coping skills Self-care - nutrition, sleep, exercise Attention training - use of mindfulness Mindfulness practices Deep breathing Grounding techniques as necessary STOP technique Distractions   Treatment Target: Increase realistic balanced thinking specifically related to co-parenting / custody  Explore patient's thoughts, beliefs, automatic thoughts, assumptions  Identify unhelpful thinking patterns ,upsetting ideas, self-talk and mental images Replace thinking that leads to distress  Process distress/trauma, identify "stuck" points and allow for emotional release  Cognitive reframing  Questioning and challenging thoughts  Future Appointments  Date Time Provider Department Center  02/27/2021 11:00 AM Kathreen Cosier, LCSW [facility] None     Kathreen Cosier, LCSW

## 2021-02-20 ENCOUNTER — Encounter: Payer: Self-pay | Admitting: Licensed Clinical Social Worker

## 2021-02-20 NOTE — Progress Notes (Signed)
February 19, 2021  This letter is written at the request and consent of Brittany Cardenas DOB 09-Jun-1991 and is provided for her use.    Ms. Linnette Panella has been a previous patient of mine and returned in September 2021. I am currently providing theraputic mental health services for Brittany Cardenas for the treatment of Adjustment Disorder, With mixed anxiety and depressed mood, secondary to child custody case. We have identified goals and developed a treatment plan to assist Brittany Cardenas in managing emotions related to co-parenting/child-custody.   In individual therapy, I am seeking to help Brittany Cardenas to better understand her emotions and to cope more effectively as they arise. We use a combination of cognitive behavioral strategies, mindfulness based practices, and supportive therapy to assist Brittany Cardenas in increasing self-awareness and also give her tools to identify and express her emotions effectively and appropriately, tolerate distressful feelings, self-soothe, and examine her thought patterns to identify both negative and positive thoughts and behavioral choices in order to improve her overall effectiveness in managing her symptoms and improve her overall effectiveness as a parent. Ms. Disa Riedlinger has been engaged, cooperative, honest, and open throughout her individual therapy.   Ms. Kanna Dafoe reports that her symptoms are overall well controlled through participation in talk-therapy, practicing self-care, and from engaging with a positive support system. She reports improvement in her ability to manage, recognize and cope with her emotions. Ms. Kindred Heying has shared her strong desire to provide a safe and secure home for her daughter. She has also shared her understanding of the importance of parenting in a developmentally appropriate manner.   Going forward, Brittany Cardenas and I plan to continue our work together to discuss any concerns  related to her daughter, parenting and/or the co-parenting relationship that arise. I believe that Ms. Avenell Sellers will be successful in appropriately parenting her daughter.   Best regards,    Kathreen Cosier, MSW, LCSW, LCAS-A Licensed Clinical Social Worker

## 2021-02-21 ENCOUNTER — Emergency Department: Payer: No Typology Code available for payment source

## 2021-02-21 ENCOUNTER — Encounter: Payer: Self-pay | Admitting: Emergency Medicine

## 2021-02-21 ENCOUNTER — Other Ambulatory Visit: Payer: Self-pay

## 2021-02-21 ENCOUNTER — Emergency Department
Admission: EM | Admit: 2021-02-21 | Discharge: 2021-02-21 | Disposition: A | Discharge status: Home / Self Care | Payer: No Typology Code available for payment source | Attending: Emergency Medicine | Admitting: Emergency Medicine

## 2021-02-21 DIAGNOSIS — S46812A Strain of other muscles, fascia and tendons at shoulder and upper arm level, left arm, initial encounter: Secondary | ICD-10-CM | POA: Diagnosis not present

## 2021-02-21 DIAGNOSIS — Z7982 Long term (current) use of aspirin: Secondary | ICD-10-CM | POA: Diagnosis not present

## 2021-02-21 DIAGNOSIS — S161XXA Strain of muscle, fascia and tendon at neck level, initial encounter: Secondary | ICD-10-CM | POA: Diagnosis not present

## 2021-02-21 DIAGNOSIS — Y92481 Parking lot as the place of occurrence of the external cause: Secondary | ICD-10-CM | POA: Insufficient documentation

## 2021-02-21 DIAGNOSIS — J45909 Unspecified asthma, uncomplicated: Secondary | ICD-10-CM | POA: Diagnosis not present

## 2021-02-21 DIAGNOSIS — Z91040 Latex allergy status: Secondary | ICD-10-CM | POA: Diagnosis not present

## 2021-02-21 DIAGNOSIS — S199XXA Unspecified injury of neck, initial encounter: Secondary | ICD-10-CM | POA: Diagnosis present

## 2021-02-21 DIAGNOSIS — R519 Headache, unspecified: Secondary | ICD-10-CM | POA: Insufficient documentation

## 2021-02-21 MED ORDER — MELOXICAM 15 MG PO TABS: 15.0000 mg | ORAL_TABLET | Freq: Every day | ORAL | 0 refills | Status: DC

## 2021-02-21 MED ORDER — METHOCARBAMOL 500 MG PO TABS: 500.0000 mg | ORAL_TABLET | Freq: Four times a day (QID) | ORAL | 0 refills | Status: DC
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
## 2021-02-26 ENCOUNTER — Encounter: Payer: Self-pay | Admitting: Gerontology

## 2021-02-26 ENCOUNTER — Ambulatory Visit: Payer: Self-pay | Admitting: Gerontology

## 2021-02-26 ENCOUNTER — Other Ambulatory Visit: Payer: Self-pay

## 2021-02-26 VITALS — BP 104/72 | HR 100 | Temp 98.0°F | Resp 16 | Ht 63.0 in | Wt 144.4 lb

## 2021-02-26 DIAGNOSIS — J039 Acute tonsillitis, unspecified: Secondary | ICD-10-CM

## 2021-02-26 MED ORDER — AZITHROMYCIN 500 MG PO TABS: 500.0000 mg | ORAL_TABLET | Freq: Every day | ORAL | 0 refills | Status: DC
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
## 2021-02-27 ENCOUNTER — Ambulatory Visit: Payer: Self-pay | Admitting: Licensed Clinical Social Worker

## 2021-03-03 ENCOUNTER — Telehealth: Payer: Self-pay | Admitting: Licensed Clinical Social Worker

## 2021-03-03 NOTE — Telephone Encounter (Signed)
12:48pm patient left vm providing verbal consent for LCSW to release information from treatment with LCSW from 2021- current.

## 2021-03-03 NOTE — Telephone Encounter (Signed)
LCSW spoke with Carmelia Bake, MSW, LCSW-A [facility] Social Worker [facility] Department of Social Services regarding request for information. LCSW provided dates of services, clarified scope of work including the CPS case, history of co-parenting issues and current issues. LCSW agreed to provide the CCA from 05/23/2020 to [place]. LCSW will leave the CCA at the front desk.

## 2021-03-12 ENCOUNTER — Ambulatory Visit: Payer: Self-pay | Admitting: Licensed Clinical Social Worker

## 2021-03-12 DIAGNOSIS — F4323 Adjustment disorder with mixed anxiety and depressed mood: Secondary | ICD-10-CM

## 2021-03-12 NOTE — Progress Notes (Signed)
Counselor/Therapist Progress Note  Patient ID: Brittany Cardenas, MRN: [id number],    Date: 03/12/2021  Time Spent: 45 minutes   Treatment Type: Psychotherapy  Reported Symptoms:  overall mood stability and manage anxiety   Mental Status Exam:  Appearance:   Casual     Behavior:  Appropriate and Sharing  Motor:  Normal  Speech/Language:   Clear and Coherent and Normal Rate  Affect:  Appropriate, Congruent, and Full Range  Mood:  normal  Thought process:  normal  Thought content:    WNL  Sensory/Perceptual disturbances:    WNL  Orientation:  oriented to person, place, time/date, and situation  Attention:  Good  Concentration:  Good  Memory:  WNL  Fund of knowledge:   Good  Insight:    Good  Judgment:   Good  Impulse Control:  Good   Risk Assessment: Danger to Self:  No Self-injurious Behavior: No Danger to Others: No Duty to Warn:no Physical Aggression / Violence:No  Access to Firearms a concern: No  Gang Involvement:No   Subjective: Patient actively participated and was engaged throughout the session using time effectively to discuss thoughts and feelings related to all the issues surrounding the current child custody / CPS case. Patient voices continued motivation for treatment and understanding of anxiety and depressive symptoms related to current psychosocial stressors. Patient is likely to benefit from future treatment because she remains highly motivated to manage symptoms and maintain functioning and reports benefit of regular sessions.     Interventions: Cognitive Behavioral Therapy Checked in with patient regarding current psychosocial stressors. Established session agenda. Engaged patient in processing issues that led up to current CPS case, actively listening as patient openly shared her experience. Reviewed information with patient about the relationship between thoughts, emotions, and behaviors. Taught patient about the impact of trauma. Engaged patient  in developing a parenting plan to support her in any future concerns related to her child's wellbeing and safety. Encouraged patient to continue to engage in self-care practices to continue to manage symptoms and maintain functioning.  Provided support through active listening, validation of feelings, and highlighted patient's strengths.   Diagnosis:   ICD-10-CM   1. Adjustment disorder with mixed anxiety and depressed mood  F43.23      Plan: Patient's goal is: develop coping skills to manage stress/distress/anxiety/emotions while in court and when dealing with issues around the co-parenting and the CPS case.  Treatment Target: Reducing vulnerability to "emotional mind"/ Increase coping skills Self-care - nutrition, sleep, exercise Attention training - use of mindfulness Mindfulness practices Deep breathing Grounding techniques as necessary STOP technique Distractions  Parent education, training and support    Treatment Target: Increase realistic balanced thinking specifically related to co-parenting / custody  Explore patient's thoughts, beliefs, automatic thoughts, assumptions Identify unhelpful thinking patterns ,upsetting ideas, self-talk and mental images Replace thinking that leads to distress Process distress/trauma, identify "stuck" points and allow for emotional release Cognitive reframing Questioning and challenging thoughts  Future Appointments  Date Time Provider Department Center  03/19/2021 10:00 AM Eulogio Bear E, NP [facility] None  03/19/2021 11:00 AM Kathreen Cosier, LCSW [facility] None    Kathreen Cosier, LCSW

## 2021-03-14 ENCOUNTER — Other Ambulatory Visit: Payer: Self-pay

## 2021-03-19 ENCOUNTER — Other Ambulatory Visit: Payer: Self-pay

## 2021-03-19 ENCOUNTER — Ambulatory Visit: Payer: Self-pay | Admitting: Gerontology

## 2021-03-19 ENCOUNTER — Encounter: Payer: Self-pay | Admitting: Gerontology

## 2021-03-19 ENCOUNTER — Ambulatory Visit: Payer: Self-pay | Admitting: Licensed Clinical Social Worker

## 2021-03-19 VITALS — BP 114/76 | HR 80 | Temp 98.6°F | Resp 16 | Ht 63.0 in | Wt 137.2 lb

## 2021-03-19 DIAGNOSIS — R4582 Worries: Secondary | ICD-10-CM | POA: Insufficient documentation

## 2021-03-19 DIAGNOSIS — J039 Acute tonsillitis, unspecified: Secondary | ICD-10-CM

## 2021-03-19 DIAGNOSIS — F4323 Adjustment disorder with mixed anxiety and depressed mood: Secondary | ICD-10-CM
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
## 2021-03-19 NOTE — Progress Notes (Signed)
Counselor/Therapist Progress Note  Patient ID: Brittany Cardenas, MRN: [id number],    Date: 03/19/2021  Time Spent: 50 minutes   Treatment Type: Psychotherapy  Reported Symptoms:  mild anxiety, anxious thoughts, moments of sadness overall static symptoms  Mental Status Exam:  Appearance:   Casual     Behavior:  Appropriate and Sharing  Motor:  Normal  Speech/Language:   Clear and Coherent and Normal Rate  Affect:  Appropriate, Congruent, and Full Range  Mood:  normal  Thought process:  goal directed  Thought content:    WNL  Sensory/Perceptual disturbances:    WNL  Orientation:  oriented to person, place, time/date, and situation  Attention:  Good  Concentration:  Good  Memory:  WNL  Fund of knowledge:   Good  Insight:    Good  Judgment:   Good  Impulse Control:  Good   Risk Assessment: Danger to Self:  No Self-injurious Behavior: No Danger to Others: No Duty to Warn:no Physical Aggression / Violence:No  Access to Firearms a concern: No  Gang Involvement:No   Subjective: Patient actively participated and was cooperative throughout the session using time effectively to discuss thoughts and feelings related to concerns about child and child custody issues. Patient voices continued motivation for treatment and understanding of the importance of managing her symptoms. Patient is likely to benefit from future treatment because she is highly motivated to decrease symptoms and improve functioning and reports benefit of regular sessions in addressing mental health symptoms.   Interventions: Cognitive Behavioral Therapy and Mindfulness Meditation Checked in with patient regarding her week. Engaged patient in processing current psychosocial stressors, mild anxiety symptoms related to concerns of child/child custody. Explored patient's perception of current challenges, highlighting patient's thoughts, emotions, and behaviors. Worked on patient's awareness of thoughts and  emotions, highlighting thoughts that led to distress and reframed these thought to create more flexibility in thinking. Praised patient for emotional regulation while in the presence of her child and returning to LCSW as planned to discuss concerns that arise. Reviewed intentional breathing exercises to manage anxiety and panic symptoms. Encouraged patient to continue to engage in self-care practices, such as working out at the gym to manage symptoms. Provided support through active listening, validation of feelings, and highlighted patient's strengths.   Diagnosis:   ICD-10-CM   1. Adjustment disorder with mixed anxiety and depressed mood  F43.23      Plan: Patient's goal is: develop coping skills to manage stress/distress/anxiety/emotions while in court and when dealing with issues around the co-parenting and the CPS case.  Treatment Target: Reducing vulnerability to "emotional mind"/ Increase coping skills Self-care - nutrition, sleep, exercise Attention training - use of mindfulness Mindfulness practices Deep breathing Grounding techniques as necessary STOP technique Distractions  Parent education, training and support    Treatment Target: Increase realistic balanced thinking specifically related to co-parenting / custody  Explore patient's thoughts, beliefs, automatic thoughts, assumptions Identify unhelpful thinking patterns ,upsetting ideas, self-talk and mental images Replace thinking that leads to distress Process distress/trauma, identify "stuck" points and allow for emotional release Cognitive reframing Questioning and challenging thoughts  Future Appointments  Date Time Provider Department Center  03/24/2021  9:00 AM Kathreen Cosier, LCSW [facility] None  09/18/2021 10:00 AM Iloabachie, Chioma E, NP [facility] None    Kathreen Cosier, LCSW

## 2021-03-24 ENCOUNTER — Ambulatory Visit: Payer: Self-pay | Admitting: Licensed Clinical Social Worker

## 2021-03-24 DIAGNOSIS — F4323 Adjustment disorder with mixed anxiety and depressed mood: Secondary | ICD-10-CM

## 2021-03-24 NOTE — Progress Notes (Signed)
Counselor/Therapist Progress Note  Patient ID: Brittany Cardenas, MRN: [id number],    Date: 03/24/2021  Time Spent: 49 minutes    Treatment Type: Psychotherapy  Reported Symptoms:  low mood, sadness, anxious thoughts  Mental Status Exam:  Appearance:   NA     Behavior:  Appropriate and Sharing  Motor:  NA  Speech/Language:   Clear and Coherent and Normal Rate  Affect:  NA  Mood:  anxious  Thought process:  normal  Thought content:    WNL  Sensory/Perceptual disturbances:    WNL  Orientation:  oriented to person, place, time/date, and situation  Attention:  Good  Concentration:  Good  Memory:  WNL  Fund of knowledge:   Good  Insight:    Good  Judgment:   Good  Impulse Control:  Good   Risk Assessment: Danger to Self:  No Self-injurious Behavior: No Danger to Others: No Duty to Warn:no Physical Aggression / Violence:No  Access to Firearms a concern: No  Gang Involvement:No   Subjective: Patient was engaged and cooperative throughout the session using time effectively to discuss thoughts and feelings. Patient voices continued motivation for treatment and understanding of distress related to child custody issues. Patient is likely to benefit from future treatment because she remains motivated to decrease distress and improve functioning and reports benefit of regular sessions in addressing these symptoms.   Interventions: Cognitive Behavioral Therapy Established psychological safety. Checked in with patient and engaged her in processing current psychosocial stressors, distress related to child custody/co-parenting/recent court date. Validated patient's feelings of sadness and concern, identifying origins of these feelings. Highlighted thoughts vs. feelings to increase awareness. Discussed stress management and a plan to manage stressors while LCSW is on PTO next week.  Provided support through active listening, validation of feelings, and highlighted patient's  strengths.   Diagnosis:   ICD-10-CM   1. Adjustment disorder with mixed anxiety and depressed mood  F43.23      Plan: Patient's goal is: develop coping skills to manage stress/distress/anxiety/emotions while in court and when dealing with issues around the co-parenting and the CPS case.  Treatment Target: Reducing vulnerability to "emotional mind"/ Increase coping skills Self-care - nutrition, sleep, exercise Attention training - use of mindfulness Mindfulness practices Deep breathing Grounding techniques as necessary STOP technique Distractions  Parent education, training and support  Treatment Target: Increase realistic balanced thinking specifically related to co-parenting / custody  Explore patient's thoughts, beliefs, automatic thoughts, assumptions Identify unhelpful thinking patterns ,upsetting ideas, self-talk and mental images Replace thinking that leads to distress Process distress/trauma, identify "stuck" points and allow for emotional release Cognitive reframing Questioning and challenging thoughts  Future Appointments  Date Time Provider Department Center  04/09/2021 11:00 AM Kathreen Cosier, LCSW [facility] None  09/18/2021 10:00 AM Iloabachie, Francoise Ceo, NP [facility] None   Kathreen Cosier, LCSW

## 2021-04-09 ENCOUNTER — Ambulatory Visit: Payer: Self-pay | Admitting: Licensed Clinical Social Worker

## 2021-04-09 DIAGNOSIS — F4323 Adjustment disorder with mixed anxiety and depressed mood: Secondary | ICD-10-CM

## 2021-04-09 NOTE — Progress Notes (Signed)
Counselor/Therapist Progress Note  Patient ID: Brittany Cardenas, MRN: [id number],    Date: 04/09/2021  Time Spent: 50 minutes   Treatment Type: Psychotherapy  Reported Symptoms:  Anxiety, anxious thoughts; irritability/frustration   Mental Status Exam:  Appearance:   Casual and Well Groomed     Behavior:  Appropriate and Sharing  Motor:  Normal  Speech/Language:   Clear and Coherent and Normal Rate  Affect:  Appropriate, Congruent, and Full Range  Mood:  anxious  Thought process:  normal  Thought content:    WNL  Sensory/Perceptual disturbances:    WNL  Orientation:  oriented to person, place, time/date, and situation  Attention:  Good  Concentration:  Good  Memory:  WNL  Fund of knowledge:   Good  Insight:    Good  Judgment:   Good  Impulse Control:  Good   Risk Assessment: Danger to Self:  No Self-injurious Behavior: No Danger to Others: No Duty to Warn:no Physical Aggression / Violence:No  Access to Firearms a concern: No  Gang Involvement:No   Subjective: Patient actively engaged throughout the session using time effectively to discuss thoughts and feelings and reviewing strategies to manage anxiety/panic symptoms. Patient voices continued motivation for treatment and understanding of mood and anxiety issues related to CPS/child custody issues. Patient is likely to benefit from future treatment because she remains motivated to decrease distress and improve functioning and reports benefit of regular sessions in addressing these symptoms.   Interventions: Cognitive Behavioral Therapy and Psycho-education/Bibliotherapy Checked in with patient. Engaged patient in processing CPS/child custody/co-parenting concerns providing a support space. Highlighted thoughts and reframed thoughts leading to distress to create more flexible thinking. Helped patient understand the function of her thoughts, emotions and behaviors. Reviewed noticing thoughts leading to distress and  using strategies such as positive self talk and emotional regulation techniques such as deep breathing and acting opposite to emotion. Provided support through active listening, validation of feelings, and highlighted patient's strengths.   Diagnosis:   ICD-10-CM   1. Adjustment disorder with mixed anxiety and depressed mood  F43.23      Plan: Patient's goal is: develop coping skills to manage stress/distress/anxiety/emotions while in court and when dealing with issues around the co-parenting and the CPS case.  Treatment Target: Reducing vulnerability to "emotional mind"/ Increase coping skills Self-care - nutrition, sleep, exercise Attention training - use of mindfulness Mindfulness practices Deep breathing Grounding techniques as necessary STOP technique Distractions  Parent education, training and support  Treatment Target: Increase realistic balanced thinking specifically related to co-parenting / custody  Explore patient's thoughts, beliefs, automatic thoughts, assumptions Identify unhelpful thinking patterns ,upsetting ideas, self-talk and mental images Replace thinking that leads to distress Process distress/trauma, identify "stuck" points and allow for emotional release Cognitive reframing Questioning and challenging thoughts  Future Appointments  Date Time Provider Department Center  04/14/2021 10:00 AM Kathreen Cosier, LCSW [facility] None  09/18/2021 10:00 AM Iloabachie, Francoise Ceo, NP [facility] None   Kathreen Cosier, LCSW

## 2021-04-10 ENCOUNTER — Telehealth: Payer: Self-pay | Admitting: Licensed Clinical Social Worker

## 2021-04-10 NOTE — Telephone Encounter (Signed)
LCSW called to check in with patient after she had child custody court today. Patient appeared calm. LCSW encouraged patient to engage with positive peers.

## 2021-04-14 ENCOUNTER — Ambulatory Visit: Payer: Self-pay | Admitting: Licensed Clinical Social Worker

## 2021-05-07 ENCOUNTER — Ambulatory Visit: Payer: Self-pay | Admitting: Licensed Clinical Social Worker

## 2021-05-07 DIAGNOSIS — F4323 Adjustment disorder with mixed anxiety and depressed mood: Secondary | ICD-10-CM

## 2021-05-07 NOTE — Progress Notes (Signed)
Counselor/Therapist Progress Note  Patient ID: Brittany Cardenas, MRN: [id number],    Date: 05/07/2021  Time Spent: 1 hour and 9 minutes    Treatment Type: Psychotherapy  Reported Symptoms: Obsessive thinking and depressed mood, sadness, frustration, anxiety, anxious thoughts   Mental Status Exam:  Appearance:   Casual     Behavior:  Appropriate and Sharing  Motor:  Normal  Speech/Language:   Clear and Coherent and Normal Rate  Affect:  Appropriate, Congruent, and Full Range  Mood:  normal  Thought process:  normal  Thought content:    WNL  Sensory/Perceptual disturbances:    WNL  Orientation:  oriented to person, place, time/date, situation, and day of week  Attention:  Good  Concentration:  Good  Memory:  WNL  Fund of knowledge:   Good  Insight:    Good  Judgment:   Good  Impulse Control:  Good   Risk Assessment: Danger to Self:  No Self-injurious Behavior: No Danger to Others: No Duty to Warn:no Physical Aggression / Violence:No  Access to Firearms a concern: No  Gang Involvement:No   Subjective: Patient was engaged and cooperative throughout the session using time effectively to discuss thoughts and feelings related to concerns of child's wellbeing while in the care of her father. Patient voices continued motivation for treatment and understanding of anxiety secondary to child custody/co-parenting challenges. Patient is likely to benefit from future treatment because she remains motivated to manage symptoms and improve functioning and reports benefit of regular sessions.   Interventions: Cognitive Behavioral Therapy Checked-in with patient and engaged her in processing current psychosocial stressors, mild anxiety due to co-parenting/child custody issues. Discussed patient's concerns related to recent interaction with child. Explored patient's perceptions, highlighting, challenging and discussing these perceptions. Engaged patient in discussing options to share  her concerns related to her child's behavior and encouraged her to discuss concerns with her child's therapist. Encouraged patient to increase self-care. Provided support through active listening, validation of feelings, and highlighted patient's strengths.   Diagnosis:   ICD-10-CM   1. Adjustment disorder with mixed anxiety and depressed mood  F43.23      Plan: Patient's goal is: develop coping skills to manage stress/distress/anxiety/emotions while in court and when dealing with issues around the co-parenting and the CPS case.  Treatment Target: Reducing vulnerability to "emotional mind"/ Increase coping skills Self-care - nutrition, sleep, exercise Attention training - use of mindfulness Mindfulness practices Deep breathing Grounding techniques as necessary STOP technique Distractions  Parent education, training and support  Treatment Target: Increase realistic balanced thinking specifically related to co-parenting / custody  Explore patient's thoughts, beliefs, automatic thoughts, assumptions Identify unhelpful thinking patterns ,upsetting ideas, self-talk and mental images Replace thinking that leads to distress Process distress/trauma, identify "stuck" points and allow for emotional release Cognitive reframing Questioning and challenging thoughts  Future Appointments  Date Time Provider Department Center  05/22/2021 10:00 AM Kathreen Cosier, LCSW [facility] None  09/18/2021 10:00 AM Iloabachie, Francoise Ceo, NP [facility] None   Kathreen Cosier, LCSW

## 2021-05-09 ENCOUNTER — Ambulatory Visit
Admission: EM | Admit: 2021-05-09 | Disposition: A | Discharge status: Other Acute Inpatient Hospital | Payer: No Typology Code available for payment source

## 2021-05-12 ENCOUNTER — Telehealth: Payer: Self-pay | Admitting: Licensed Clinical Social Worker

## 2021-05-12 NOTE — Telephone Encounter (Signed)
LCSW provided information to [facility], including number of visits in August and September. Also informed Redmond Baseman of treatment plan goal; shared what was discussed at last visit "concerns with interaction with daughter and LCSW recommended patient share her concerns with her daughter's therapist." LCSW informed Redmond Baseman that patient has been following the plan to bring any concerns regarding her child to therapy sessions to discuss. And LCSW also shared with Diego Cory that patient is not recommended for medication management by her.

## 2021-05-15 ENCOUNTER — Ambulatory Visit: Payer: Self-pay | Admitting: Gerontology

## 2021-05-22 ENCOUNTER — Ambulatory Visit: Payer: Self-pay | Admitting: Licensed Clinical Social Worker

## 2021-05-22 DIAGNOSIS — F4323 Adjustment disorder with mixed anxiety and depressed mood: Secondary | ICD-10-CM

## 2021-05-22 NOTE — Progress Notes (Signed)
Counselor/Therapist Progress Note  Patient ID: Brittany Cardenas, MRN: [id number],    Date: 05/22/2021  Time Spent: 1 hour   Treatment Type: Psychotherapy  Reported Symptoms: Sleep disturbance, Physical aches and pain, and intermittent sadness and low mood; anxiety, anxious thoughts  Mental Status Exam:  Appearance:   Casual     Behavior:  Appropriate and Sharing  Motor:  Normal  Speech/Language:   Clear and Coherent and Normal Rate  Affect:  Appropriate and Congruent  Mood:  normal  Thought process:  normal  Thought content:    WNL  Sensory/Perceptual disturbances:    WNL  Orientation:  oriented to person, place, time/date, and situation  Attention:  Good  Concentration:  Good  Memory:  WNL  Fund of knowledge:   Good  Insight:    Good  Judgment:   Good  Impulse Control:  Good   Risk Assessment: Danger to Self:  No Self-injurious Behavior: No Danger to Others: No Duty to Warn:no Physical Aggression / Violence:No  Access to Firearms a concern: No  Gang Involvement:No   Subjective: Patient was receptive to feedback and intervention from LCSW and actively and effectively participated in session. Patient voices continued motivation for treatment and understanding of mood and anxiety issues related to multiple stressors. Patient is likely to benefit from future treatment because she remains motivated to manage symptoms and improve functioning and reports benefit of regular sessions in addressing symptoms.   Interventions: Cognitive Behavioral Therapy Checked-in with patient. LCSW assisted patient in processing her thoughts and emotions related to custody/co-parenting challenges and struggles at her job. Reviewed  distress tolerance skills- Mindfulness and taught patient tips on sleep hygiene. Provided support through active listening, validation of feelings, and highlighted patient's strengths.   Diagnosis:   ICD-10-CM   1. Adjustment disorder with mixed anxiety and  depressed mood  F43.23      Plan: Patient's goal is: develop coping skills to manage stress/distress/anxiety/emotions while in court and when dealing with issues around the co-parenting and the CPS case.  Treatment Target: Reducing vulnerability to "emotional mind"/ Increase coping skills Self-care - nutrition, sleep, exercise Attention training - use of mindfulness Mindfulness practices Deep breathing Grounding techniques as necessary STOP technique Distractions  Parent education, training and support  Treatment Target: Increase realistic balanced thinking specifically related to co-parenting / custody  Explore patient's thoughts, beliefs, automatic thoughts, assumptions Identify unhelpful thinking patterns ,upsetting ideas, self-talk and mental images Replace thinking that leads to distress Process distress/trauma, identify "stuck" points and allow for emotional release Cognitive reframing Questioning and challenging thoughts  Future Appointments  Date Time Provider Department Center  06/05/2021  1:20 PM Kathreen Cosier, LCSW [facility] None  09/18/2021 10:00 AM Iloabachie, Chioma E, NP [facility] None   Kathreen Cosier, LCSW

## 2021-06-04 ENCOUNTER — Encounter: Payer: Self-pay | Admitting: Gerontology

## 2021-06-04 ENCOUNTER — Ambulatory Visit: Payer: Self-pay | Admitting: Gerontology

## 2021-06-04 VITALS — BP 118/77 | HR 81 | Temp 98.7°F | Resp 16 | Ht 63.0 in | Wt 141.9 lb

## 2021-06-04 DIAGNOSIS — N898 Other specified noninflammatory disorders of vagina: Secondary | ICD-10-CM

## 2021-06-04 DIAGNOSIS — R599 Enlarged lymph nodes, unspecified: Secondary | ICD-10-CM | POA: Insufficient documentation

## 2021-06-04 DIAGNOSIS — Z8709 Personal history of other diseases of the respiratory system: Secondary | ICD-10-CM

## 2021-06-04 MED ORDER — ALBUTEROL SULFATE HFA 108 (90 BASE) MCG/ACT IN AERS: INHALATION_SPRAY | RESPIRATORY_TRACT | 3 refills | Status: DC

## 2021-06-04 MED ORDER — FLUCONAZOLE 150 MG PO TABS
150.0000 mg | ORAL_TABLET | Freq: Every day | ORAL | 0 refills | Status: DC
Start: 2021-06-04 — End: 2021-10-02

## 2021-06-04 NOTE — Progress Notes (Signed)
Established Patient Office Visit  Subjective:  Patient ID: Brittany Cardenas, female    DOB: [birth year]  Age: 30 y.o. MRN: [id number]  CC:  Chief Complaint  Patient presents with   Follow-up    Patient in today stating that she had a Cervical MRI recently due to a worker's comp injury at [facility]. Patient states that MRI showed 2 masses in the neck.    HPI Brittany Cardenas is a 30 year old female who has history of allergy, asthma, hepatitis, migraine, presents for complaint of prominent lymph nodes  on MRI report. She had cervical MRI done due to neck injury sustained at work and it showed mildly prominent lymph nodes along the jugular chains bilaterally, favored to benign and reactive. Clinical and laboratory correlation would be of value in excluding a lymphoproliferative disorder. She  denies fever, chills  and continues to follow up with physical therapy. She also c/o yellowish vaginal discharge  associated with itching, that has been going on for 5 days. She denies dysuria, urinary freqiency/urgency, flank/pelvic pain. She reports using otc Miconazole vaginal cream without relief. Overall, she states that she's doing well and offers no further complaint.  Past Medical History:  Diagnosis Date   Allergy    Asthma    Patient reported   Constipation    Herpes simplex virus (HSV) infection    Patient reported   Migraine    Ovarian cyst 2012   per patient report    Past Surgical History:  Procedure Laterality Date   NO PAST SURGERIES      Family History  Problem Relation Age of Onset   Hypertension Mother    Asthma Mother    Stroke Mother    Migraines Mother    Other Father        murdered   Scoliosis Father    Migraines Sister    Hyperlipidemia Maternal Grandmother    Hypertension Maternal Grandmother    Varicose Veins Maternal Grandmother    Cancer Maternal Grandfather        kidney cancer   Ovarian cancer Paternal Grandmother    Breast  cancer Paternal Grandmother 30   Cancer Paternal Grandmother        ovarian cancer    Social History   Socioeconomic History   Marital status: Divorced    Spouse name: NA   Number of children: 1   Years of education: 16   Highest education level: Bachelor's degree (e.g., BA, AB, BS)  Occupational History   Occupation: Employed  Tobacco Use   Smoking status: Never   Smokeless tobacco: Never  Vaping Use   Vaping Use: Never used  Substance and Sexual Activity   Alcohol use: Never   Drug use: Never   Sexual activity: Not Currently    Birth control/protection: I.U.D.    Comment: paraguard   Other Topics Concern   Not on file  Social History Narrative   Patient is a single mom, who lives with her mom, sister and niece. She is employed, and has a good support system with family and friends.     Social Determinants of Health   Financial Resource Strain: Not on file  Food Insecurity: No Food Insecurity   Worried About Programme researcher, broadcasting/film/video in the Last Year: Never true   Ran Out of Food in the Last Year: Never true  Transportation Needs: No Transportation Needs   Lack of Transportation (Medical): No   Lack of Transportation (Non-Medical): No  Physical  Activity: Not on file  Stress: Not on file  Social Connections: Not on file  Intimate Partner Violence: Not on file    Outpatient Medications Prior to Visit  Medication Sig Dispense Refill   acyclovir (ZOVIRAX) 800 MG tablet Take 1 tablet (800 mg total) by mouth daily. 30 tablet 12   aspirin-acetaminophen-caffeine (EXCEDRIN MIGRAINE) 250-250-65 MG tablet Take 1 tablet by mouth daily as needed for headache or migraine. Usually during menses     loratadine (CLARITIN) 10 MG tablet Take 10 mg by mouth daily.     meloxicam (MOBIC) 15 MG tablet meloxicam 15 mg tablet  Take 1 tablet every day by oral route as needed.     PARAGARD INTRAUTERINE COPPER IU by Intrauterine route. Placed ~2018 per pt.     SUMAtriptan (IMITREX) 50 MG tablet  TAKE ONE TABLET BY MOUTH ONCE FOR 1 DOSE, MAY REPEAT IN 2 HOURS IF HEADACHE PERSISTS OR RECURS 9 tablet 0   albuterol (VENTOLIN HFA) 108 (90 Base) MCG/ACT inhaler INHALE 2 PUFFS INTO THE LUNGS EVERY 6 HOURS AS NEEDED FOR WHEEZING OR SHORT OF BREATH 6.7 g 3   methocarbamol (ROBAXIN) 500 MG tablet Take 500 mg by mouth 2 (two) times daily. (Patient not taking: Reported on 06/04/2021)     methylPREDNISolone (MEDROL) 4 MG TBPK tablet Medrol (Pak) 4 mg tablets in a dose pack  Take 1 dose pk by oral route.     No facility-administered medications prior to visit.    Allergies  Allergen Reactions   Penicillin G Itching   Penicillins    Pollen Extract Other (See Comments)    Sneezing   Shellfish Allergy    Latex Rash    ROS Review of Systems  Constitutional: Negative.   Respiratory: Negative.    Cardiovascular: Negative.   Neurological: Negative.      Objective:    Physical Exam HENT:     Head: Normocephalic and atraumatic.  Cardiovascular:     Rate and Rhythm: Normal rate and regular rhythm.     Pulses: Normal pulses.     Heart sounds: Normal heart sounds.  Pulmonary:     Effort: Pulmonary effort is normal.     Breath sounds: Normal breath sounds.  Neurological:     General: No focal deficit present.     Mental Status: She is alert and oriented to person, place, and time. Mental status is at baseline.    BP 118/77 (BP Location: Right Arm, Patient Position: Sitting, Cuff Size: Normal)   Pulse 81   Temp 98.7 F (37.1 C)   Resp 16   Ht 5\' 3"  (1.6 m)   Wt 141 lb 14.4 oz (64.4 kg)   SpO2 97%   BMI 25.14 kg/m  Wt Readings from Last 3 Encounters:  06/04/21 141 lb 14.4 oz (64.4 kg)  03/19/21 137 lb 3.2 oz (62.2 kg)  02/26/21 144 lb 6.4 oz (65.5 kg)     Health Maintenance Due  Topic Date Due   Hepatitis C Screening  Never done   COVID-19 Vaccine (3 - Pfizer risk series) 01/30/2020   INFLUENZA VACCINE  Never done   PAP SMEAR-Modifier  05/24/2021    There are no  preventive care reminders to display for this patient.  Lab Results  Component Value Date   TSH 2.700 09/25/2020   Lab Results  Component Value Date   WBC 6.0 09/25/2020   HGB 12.6 09/25/2020   HCT 39.7 09/25/2020   MCV 92 09/25/2020   PLT 265  09/25/2020   Lab Results  Component Value Date   NA 143 09/25/2020   K 4.1 09/25/2020   CO2 24 09/25/2020   GLUCOSE 74 09/25/2020   BUN 13 09/25/2020   CREATININE 0.67 09/25/2020   BILITOT <0.2 09/25/2020   ALKPHOS 76 09/25/2020   AST 19 09/25/2020   ALT 16 09/25/2020   PROT 7.0 09/25/2020   ALBUMIN 4.1 09/25/2020   CALCIUM 9.1 09/25/2020   ANIONGAP 11 04/07/2016   Lab Results  Component Value Date   CHOL 128 09/25/2020   Lab Results  Component Value Date   HDL 54 09/25/2020   Lab Results  Component Value Date   LDLCALC 59 09/25/2020   Lab Results  Component Value Date   TRIG 72 09/25/2020   Lab Results  Component Value Date   CHOLHDL 2.4 09/25/2020   No results found for: HGBA1C    Assessment & Plan:   1. Enlarged lymph node - No jugular lymph node was palpated, she was encouraged to complete [facility] financial application for - Ambulatory referral to Hematology / Oncology  2. History of asthma - Her breathing is stable and will continue on current medication. - albuterol (VENTOLIN HFA) 108 (90 Base) MCG/ACT inhaler; INHALE 2 PUFFS INTO THE LUNGS EVERY 6 HOURS AS NEEDED FOR WHEEZING OR SHORT OF BREATH  Dispense: 6.7 g; Refill: 3  3. Vaginal discharge - Unable to collect specimen, she will be treated prophylactically for yeast infection with Diflucan. She was educated on medication side effects and advised to notify clinic. - fluconazole (DIFLUCAN) 150 MG tablet; Take 1 tablet (150 mg total) by mouth daily.  Dispense: 2 tablet; Refill: 0     Follow-up: Return in about 7 weeks (around 07/24/2021), or if symptoms worsen or fail to improve.    Roxas Clymer Trellis Paganini, NP

## 2021-06-05 ENCOUNTER — Ambulatory Visit: Payer: Self-pay | Admitting: Licensed Clinical Social Worker

## 2021-06-11 ENCOUNTER — Other Ambulatory Visit: Payer: Self-pay

## 2021-06-18 ENCOUNTER — Other Ambulatory Visit: Payer: Self-pay

## 2021-06-18 ENCOUNTER — Inpatient Hospital Stay: Payer: Self-pay

## 2021-06-18 ENCOUNTER — Encounter: Payer: Self-pay | Admitting: Oncology

## 2021-06-18 ENCOUNTER — Inpatient Hospital Stay: Payer: Self-pay | Attending: Oncology | Admitting: Oncology

## 2021-06-18 VITALS — BP 120/87 | HR 92 | Temp 98.3°F | Resp 18 | Wt 141.7 lb

## 2021-06-18 DIAGNOSIS — Z8709 Personal history of other diseases of the respiratory system: Secondary | ICD-10-CM | POA: Insufficient documentation

## 2021-06-18 DIAGNOSIS — M542 Cervicalgia: Secondary | ICD-10-CM | POA: Insufficient documentation

## 2021-06-18 DIAGNOSIS — Z8349 Family history of other endocrine, nutritional and metabolic diseases: Secondary | ICD-10-CM | POA: Insufficient documentation

## 2021-06-18 DIAGNOSIS — Z8041 Family history of malignant neoplasm of ovary: Secondary | ICD-10-CM | POA: Insufficient documentation

## 2021-06-18 DIAGNOSIS — R630 Anorexia: Secondary | ICD-10-CM | POA: Insufficient documentation

## 2021-06-18 DIAGNOSIS — R040 Epistaxis: Secondary | ICD-10-CM

## 2021-06-18 DIAGNOSIS — R519 Headache, unspecified: Secondary | ICD-10-CM | POA: Insufficient documentation

## 2021-06-18 DIAGNOSIS — Z836 Family history of other diseases of the respiratory system: Secondary | ICD-10-CM | POA: Insufficient documentation

## 2021-06-18 DIAGNOSIS — R61 Generalized hyperhidrosis: Secondary | ICD-10-CM

## 2021-06-18 DIAGNOSIS — Z803 Family history of malignant neoplasm of breast: Secondary | ICD-10-CM | POA: Insufficient documentation

## 2021-06-18 DIAGNOSIS — G4700 Insomnia, unspecified: Secondary | ICD-10-CM | POA: Insufficient documentation

## 2021-06-18 DIAGNOSIS — R233 Spontaneous ecchymoses: Secondary | ICD-10-CM

## 2021-06-18 DIAGNOSIS — Z823 Family history of stroke: Secondary | ICD-10-CM | POA: Insufficient documentation

## 2021-06-18 DIAGNOSIS — R591 Generalized enlarged lymph nodes: Secondary | ICD-10-CM | POA: Insufficient documentation

## 2021-06-18 DIAGNOSIS — Z8051 Family history of malignant neoplasm of kidney: Secondary | ICD-10-CM | POA: Insufficient documentation

## 2021-06-18 DIAGNOSIS — R5383 Other fatigue: Secondary | ICD-10-CM

## 2021-06-18 DIAGNOSIS — Z8249 Family history of ischemic heart disease and other diseases of the circulatory system: Secondary | ICD-10-CM | POA: Insufficient documentation

## 2021-06-18 DIAGNOSIS — Z8269 Family history of other diseases of the musculoskeletal system and connective tissue: Secondary | ICD-10-CM | POA: Insufficient documentation

## 2021-06-18 DIAGNOSIS — Z880 Allergy status to penicillin: Secondary | ICD-10-CM | POA: Insufficient documentation

## 2021-06-18 LAB — COMPREHENSIVE METABOLIC PANEL
ALT: 22 U/L (ref 0–44)
AST: 26 U/L (ref 15–41)
Albumin: 4.4 g/dL (ref 3.5–5.0)
Alkaline Phosphatase: 59 U/L (ref 38–126)
Anion gap: 7 (ref 5–15)
BUN: 13 mg/dL (ref 6–20)
CO2: 27 mmol/L (ref 22–32)
Calcium: 9.3 mg/dL (ref 8.9–10.3)
Chloride: 103 mmol/L (ref 98–111)
Creatinine, Ser: 0.61 mg/dL (ref 0.44–1.00)
GFR, Estimated: >60 mL/min (ref >60)
Glucose, Bld: 91 mg/dL (ref 70–99)
Potassium: 3.8 mmol/L (ref 3.5–5.1)
Sodium: 137 mmol/L (ref 135–145)
Total Bilirubin: 0.5 mg/dL (ref 0.3–1.2)
Total Protein: 8 g/dL (ref 6.5–8.1)

## 2021-06-18 LAB — CBC WITH DIFFERENTIAL/PLATELET
Abs Immature Granulocytes: 0.01 10*3/uL (ref 0.00–0.07)
Basophils Absolute: 0.1 10*3/uL (ref 0.0–0.1)
Basophils Relative: 1 %
Eosinophils Absolute: 0.5 10*3/uL (ref 0.0–0.5)
Eosinophils Relative: 9 %
HCT: 42.3 % (ref 36.0–46.0)
Hemoglobin: 14.1 g/dL (ref 12.0–15.0)
Immature Granulocytes: 0 %
Lymphocytes Relative: 26 %
Lymphs Abs: 1.4 10*3/uL (ref 0.7–4.0)
MCH: 30.1 pg (ref 26.0–34.0)
MCHC: 33.3 g/dL (ref 30.0–36.0)
MCV: 90.2 fL (ref 80.0–100.0)
Monocytes Absolute: 0.4 10*3/uL (ref 0.1–1.0)
Monocytes Relative: 8 %
Neutro Abs: 2.9 10*3/uL (ref 1.7–7.7)
Neutrophils Relative %: 56 %
Platelets: 292 10*3/uL (ref 150–400)
RBC: 4.69 MIL/uL (ref 3.87–5.11)
RDW: 12.5 % (ref 11.5–15.5)
WBC: 5.1 10*3/uL (ref 4.0–10.5)
nRBC: 0 % (ref 0.0–0.2)

## 2021-06-18 LAB — IRON AND TIBC
Iron: 72 ug/dL (ref 28–170)
Saturation Ratios: 17 % (ref 10.4–31.8)
TIBC: 431 ug/dL (ref 250–450)
UIBC: 359 ug/dL

## 2021-06-18 LAB — LACTATE DEHYDROGENASE: LDH: 108 U/L (ref 98–192)

## 2021-06-18 LAB — TSH: TSH: 4.901 u[IU]/mL — ABNORMAL HIGH (ref 0.350–4.500)

## 2021-06-18 LAB — FERRITIN: Ferritin: 38 ng/mL (ref 11–307)

## 2021-06-18 LAB — TECHNOLOGIST SMEAR REVIEW: Plt Morphology: ADEQUATE

## 2021-06-18 LAB — PROTIME-INR
INR: 1 (ref 0.8–1.2)
Prothrombin Time: 12.8 seconds (ref 11.4–15.2)

## 2021-06-18 LAB — APTT: aPTT: 28 seconds (ref 24–36)

## 2021-06-18 NOTE — Progress Notes (Signed)
Pt here to [facility] care. Pt reports that she has been feeling extreme tiredness, even when just waking up. She has night sweats, insomnia and frequent headaches. Pt also reports decreased appetite

## 2021-06-18 NOTE — Progress Notes (Signed)
Hematology/Oncology Consult note [facility] Telephone:([phone] [phone]   Patient Care Team: Milana Huntsman, NP as PCP - General (Nurse Practitioner) Jim Like, RN as Registered Nurse Scarlett Presto, RN as Registered Nurse Alvester Morin Isa Rankin, MD as Consulting Physician (Obstetrics and Gynecology)  REFERRING PROVIDER: Rolm Gala, NP  CHIEF COMPLAINTS/REASON FOR VISIT:  Evaluation of lymphadenopathy  HISTORY OF PRESENTING ILLNESS:   Brittany Cardenas is a  30 y.o.  female with PMH listed below was seen in consultation at the request of  Iloabachie, Chioma E, NP  for evaluation of lymphadenopathy Patient recently had MRI cervical spine without contrast done for evaluation of neck pain MRI was done [facility]. There was shallow central disc protrusion at C3-C4-C6-C7 disc levels with ventral thecal sac effacement.  No high-grade neural effacement.  Mildly prominent lymph nodes along the jugular chain bilaterally, favored to be benign and reactive.  Patient has multiple complaints today.  Reports feeling extremely tired, easy bruising.  She showed me pictures of intermittent epistaxis and also bruising on her lower extremities.  Also reports night sweats.  She is not able to give me an estimate of duration of her symptoms. + Insomnia and frequent headache.  Decreased appetite. She also report losing about 10 kg within the past 12 months.   Review of Systems  Constitutional:  Positive for appetite change, fatigue and unexpected weight change. Negative for chills and fever.  HENT:   Negative for hearing loss and voice change.   Eyes:  Negative for eye problems.  Respiratory:  Negative for chest tightness and cough.   Cardiovascular:  Negative for chest pain.  Gastrointestinal:  Negative for abdominal distention, abdominal pain and blood in stool.  Endocrine: Negative for hot flashes.  Genitourinary:  Negative for  difficulty urinating and frequency.   Musculoskeletal:  Positive for neck pain. Negative for arthralgias.  Skin:  Negative for itching and rash.  Neurological:  Negative for extremity weakness.  Hematological:  Negative for adenopathy. Bruises/bleeds easily.  Psychiatric/Behavioral:  Negative for confusion.    MEDICAL HISTORY:  Past Medical History:  Diagnosis Date   Allergy    Asthma    Patient reported   Constipation    Herpes simplex virus (HSV) infection    Patient reported   Migraine    Ovarian cyst 2012   per patient report    SURGICAL HISTORY: Past Surgical History:  Procedure Laterality Date   NO PAST SURGERIES      SOCIAL HISTORY: Social History   Socioeconomic History   Marital status: Divorced    Spouse name: NA   Number of children: 1   Years of education: 16   Highest education level: Bachelor's degree (e.g., BA, AB, BS)  Occupational History   Occupation: Employed  Tobacco Use   Smoking status: Never   Smokeless tobacco: Never  Vaping Use   Vaping Use: Never used  Substance and Sexual Activity   Alcohol use: Yes    Comment: socially/ occasional   Drug use: Never   Sexual activity: Not Currently    Birth control/protection: I.U.D.    Comment: paraguard   Other Topics Concern   Not on file  Social History Narrative   Patient is a single mom, who lives with her mom, sister and niece. She is employed, and has a good support system with family and friends.     Social Determinants of Health   Financial Resource Strain: Not on file  Food  Insecurity: No Food Insecurity   Worried About Programme researcher, broadcasting/film/video in the Last Year: Never true   Ran Out of Food in the Last Year: Never true  Transportation Needs: No Transportation Needs   Lack of Transportation (Medical): No   Lack of Transportation (Non-Medical): No  Physical Activity: Not on file  Stress: Not on file  Social Connections: Not on file  Intimate Partner Violence: Not on file    FAMILY  HISTORY: Family History  Problem Relation Age of Onset   Hypertension Mother    Asthma Mother    Stroke Mother    Migraines Mother    Other Father        murdered   Scoliosis Father    Migraines Sister    Hyperlipidemia Maternal Grandmother    Hypertension Maternal Grandmother    Varicose Veins Maternal Grandmother    Cancer Maternal Grandfather        kidney cancer   Ovarian cancer Paternal Grandmother    Breast cancer Paternal Grandmother 30   Cancer Paternal Grandmother        ovarian cancer    ALLERGIES:  is allergic to penicillin g, penicillins, pollen extract, shellfish allergy, and latex.  MEDICATIONS:  Current Outpatient Medications  Medication Sig Dispense Refill   acyclovir (ZOVIRAX) 800 MG tablet Take 1 tablet (800 mg total) by mouth daily. 30 tablet 12   albuterol (VENTOLIN HFA) 108 (90 Base) MCG/ACT inhaler INHALE 2 PUFFS INTO THE LUNGS EVERY 6 HOURS AS NEEDED FOR WHEEZING OR SHORT OF BREATH 6.7 g 3   aspirin-acetaminophen-caffeine (EXCEDRIN MIGRAINE) 250-250-65 MG tablet Take 1 tablet by mouth daily as needed for headache or migraine. Usually during menses     fluconazole (DIFLUCAN) 150 MG tablet Take 1 tablet (150 mg total) by mouth daily. 2 tablet 0   loratadine (CLARITIN) 10 MG tablet Take 10 mg by mouth daily.     meloxicam (MOBIC) 15 MG tablet meloxicam 15 mg tablet  Take 1 tablet every day by oral route as needed.     methocarbamol (ROBAXIN) 500 MG tablet Take 500 mg by mouth 2 (two) times daily.     PARAGARD INTRAUTERINE COPPER IU by Intrauterine route. Placed ~2018 per pt.     SUMAtriptan (IMITREX) 50 MG tablet TAKE ONE TABLET BY MOUTH ONCE FOR 1 DOSE, MAY REPEAT IN 2 HOURS IF HEADACHE PERSISTS OR RECURS 9 tablet 0   No current facility-administered medications for this visit.     PHYSICAL EXAMINATION: ECOG PERFORMANCE STATUS: 1 - Symptomatic but completely ambulatory Vitals:   06/18/21 1054  BP: 120/87  Pulse: 92  Resp: 18  Temp: 98.3 F (36.8  C)   Filed Weights   06/18/21 1054  Weight: 141 lb 11.2 oz (64.3 kg)    Physical Exam Constitutional:      General: She is not in acute distress. HENT:     Head: Normocephalic and atraumatic.  Eyes:     General: No scleral icterus. Cardiovascular:     Rate and Rhythm: Normal rate and regular rhythm.     Heart sounds: Normal heart sounds.  Pulmonary:     Effort: Pulmonary effort is normal. No respiratory distress.     Breath sounds: No wheezing.  Abdominal:     General: Bowel sounds are normal. There is no distension.     Palpations: Abdomen is soft.  Musculoskeletal:        General: No deformity. Normal range of motion.     Cervical back:  Normal range of motion and neck supple.  Skin:    General: Skin is warm and dry.     Findings: No erythema or rash.  Neurological:     Mental Status: She is alert and oriented to person, place, and time. Mental status is at baseline.     Cranial Nerves: No cranial nerve deficit.     Coordination: Coordination normal.  Psychiatric:        Mood and Affect: Mood normal.    LABORATORY DATA:  I have reviewed the data as listed Lab Results  Component Value Date   WBC 5.1 06/18/2021   HGB 14.1 06/18/2021   HCT 42.3 06/18/2021   MCV 90.2 06/18/2021   PLT 292 06/18/2021   Recent Labs    09/25/20 1011 06/18/21 1126  NA 143 137  K 4.1 3.8  CL 104 103  CO2 24 27  GLUCOSE 74 91  BUN 13 13  CREATININE 0.67 0.61  CALCIUM 9.1 9.3  GFRNONAA 118 >60  GFRAA 136  --   PROT 7.0 8.0  ALBUMIN 4.1 4.4  AST 19 26  ALT 16 22  ALKPHOS 76 59  BILITOT <0.2 0.5   Iron/TIBC/Ferritin/ %Sat    Component Value Date/Time   IRON 72 06/18/2021 1126   TIBC 431 06/18/2021 1126   FERRITIN 38 06/18/2021 1126   IRONPCTSAT 17 06/18/2021 1126      RADIOGRAPHIC STUDIES: I have personally reviewed the radiological images as listed and agreed with the findings in the report. No results found.    ASSESSMENT & PLAN:  1. Lymphadenopathy   2.  Easy bruising   3. Epistaxis   4. Other fatigue   5. Night sweats    #Multiple lymphadenopathy, patient has very mild prominent lymph nodes along the jugular vein chains which could be reactive.  On physical Examination, I am not able to appreciate any lymph node adenopathy.  Along with her multiple other complaints including easy bruising, epistaxis, fatigue, night sweats , I recommend patient to check CBC, CMP, ANA, smear, iron TIBC ferritin, TSH, PT, PTT, flow cytometry, LDH, von Willebrand panel and TB gold standard testing.  Orders Placed This Encounter  Procedures   Comprehensive metabolic panel    Standing Status:   Future    Number of Occurrences:   1    Standing Expiration Date:   06/18/2022   CBC with Differential/Platelet    Standing Status:   Future    Number of Occurrences:   1    Standing Expiration Date:   06/18/2022   Technologist smear review    Standing Status:   Future    Number of Occurrences:   1    Standing Expiration Date:   06/18/2022   Lactate dehydrogenase    Standing Status:   Future    Number of Occurrences:   1    Standing Expiration Date:   06/18/2022   Flow cytometry panel-leukemia/lymphoma work-up    Standing Status:   Future    Number of Occurrences:   1    Standing Expiration Date:   06/18/2022   Protime-INR    Standing Status:   Future    Number of Occurrences:   1    Standing Expiration Date:   06/18/2022   APTT    Standing Status:   Future    Number of Occurrences:   1    Standing Expiration Date:   06/18/2022   Von Willebrand panel    Standing Status:  Future    Number of Occurrences:   1    Standing Expiration Date:   06/18/2022   Iron and TIBC    Standing Status:   Future    Number of Occurrences:   1    Standing Expiration Date:   06/18/2022   Ferritin    Standing Status:   Future    Number of Occurrences:   1    Standing Expiration Date:   12/17/2021   TSH    Standing Status:   Future    Number of Occurrences:   1     Standing Expiration Date:   06/18/2022   ANA, IFA (with reflex)    Standing Status:   Future    Number of Occurrences:   1    Standing Expiration Date:   06/18/2022   QuantiFERON-TB Gold Plus    Standing Status:   Future    Number of Occurrences:   1    Standing Expiration Date:   06/18/2022    All questions were answered. The patient knows to call the clinic with any problems questions or concerns.   Iloabachie, Chioma E, NP    Return of visit: 2 to 3 weeks to go over results. Thank you for this kind referral and the opportunity to participate in the care of this patient. A copy of today's note is routed to referring provider    Rickard Patience, MD, PhD Hematology Oncology [facility]  06/18/2021

## 2021-06-19 ENCOUNTER — Ambulatory Visit: Payer: Self-pay | Admitting: Licensed Clinical Social Worker

## 2021-06-19 ENCOUNTER — Other Ambulatory Visit: Payer: Self-pay

## 2021-06-19 DIAGNOSIS — R591 Generalized enlarged lymph nodes: Secondary | ICD-10-CM

## 2021-06-19 DIAGNOSIS — F4323 Adjustment disorder with mixed anxiety and depressed mood: Secondary | ICD-10-CM

## 2021-06-19 LAB — T4, FREE: Free T4: 0.78 ng/dL (ref 0.61–1.12)

## 2021-06-19 NOTE — Progress Notes (Signed)
Counselor/Therapist Progress Note  Patient ID: Brittany Cardenas, MRN: [id number],    Date: 06/19/2021  Time Spent: 50 minutes    Treatment Type: Psychotherapy  Reported Symptoms: Anhedonia, Sleep disturbance, Isolation and withdrawal, Fatigue, Physical aches and pain, and low mood, sadness  Mental Status Exam:  Appearance:   NA     Behavior:  Appropriate and Sharing  Motor:  NA  Speech/Language:   Clear and Coherent and Normal Rate  Affect:  NA  Mood:  normal  Thought process:  normal  Thought content:    WNL  Sensory/Perceptual disturbances:    WNL  Orientation:  oriented to person, place, time/date, situation, and day of week  Attention:  Good  Concentration:  Good  Memory:  WNL  Fund of knowledge:   Good  Insight:    Good  Judgment:   Good  Impulse Control:  Good   Risk Assessment: Danger to Self:  No Self-injurious Behavior: No Danger to Others: No Duty to Warn:no Physical Aggression / Violence:No  Access to Firearms a concern: No  Gang Involvement:No   Subjective: Patient was engaged and cooperative throughout the session using time effectively to discuss thoughts and feelings. Patient was receptive to feedback and intervention from LCSW. Patient voices continued motivation for treatment and understanding of depressive symptoms and anxiety symptoms. Patient is likely to benefit from future treatment because they remain motivated to decrease symptoms and reports benefit of regular sessions.        Interventions: Cognitive Behavioral Therapy and Person Centered Therapy  Established psychological safety. Checked in with patient regarding their week. LCSW assisted patient in processing their thoughts and emotions about what they are experiencing related to her health and in regard to child custody. LCSW reviewed strategies for patient to reduce distress, including spending time with her cat, spending time with loved ones and listening to music and mindfulness  practices. LCSW also discussed with patient ideas for finding a new therapist for her daughter. Provided support through active listening, validation of feelings, and highlighted patient's strengths.    Diagnosis:   ICD-10-CM   1. Adjustment disorder with mixed anxiety and depressed mood  F43.23      Plan: Patient's goal is: develop coping skills to manage stress/distress/anxiety/emotions while in court and when dealing with issues around the co-parenting and the CPS case.  Treatment Target: Reducing vulnerability to "emotional mind"/ Increase coping skills Self-care - nutrition, sleep, exercise Attention training - use of mindfulness Mindfulness practices Deep breathing Grounding techniques as necessary STOP technique Distractions  Parent education, training and support  Treatment Target: Increase realistic balanced thinking specifically related to co-parenting / custody  Explore patient's thoughts, beliefs, automatic thoughts, assumptions Identify unhelpful thinking patterns ,upsetting ideas, self-talk and mental images Replace thinking that leads to distress Process distress/trauma, identify "stuck" points and allow for emotional release Cognitive reframing Questioning and challenging thoughts  Future Appointments  Date Time Provider Department Center  07/02/2021  9:40 AM Kathreen Cosier, LCSW [facility] None  07/09/2021 10:00 AM Rickard Patience, MD [facility] None  07/24/2021  9:00 AM Eulogio Bear E, NP [facility] None  09/18/2021 10:00 AM Rolm Gala, NP [facility] None   Kathreen Cosier, LCSW

## 2021-06-20 LAB — VON WILLEBRAND PANEL
Coagulation Factor VIII: 147 % — ABNORMAL HIGH (ref 56–140)
Ristocetin Co-factor, Plasma: 125 % (ref 50–200)
Von Willebrand Antigen, Plasma: 140 % (ref 50–200)

## 2021-06-20 LAB — COAG STUDIES INTERP REPORT

## 2021-06-20 LAB — ANTINUCLEAR ANTIBODIES, IFA: ANA Ab, IFA: NEGATIVE

## 2021-06-23 LAB — COMP PANEL: LEUKEMIA/LYMPHOMA

## 2021-06-26 LAB — QUANTIFERON-TB GOLD PLUS: QuantiFERON-TB Gold Plus: NEGATIVE

## 2021-06-26 LAB — QUANTIFERON-TB GOLD PLUS (RQFGPL)
QuantiFERON Mitogen Value: >10 IU/mL
QuantiFERON Nil Value: 0.11 IU/mL
QuantiFERON TB1 Ag Value: 0.08 IU/mL
QuantiFERON TB2 Ag Value: 0.07 IU/mL

## 2021-07-02 ENCOUNTER — Ambulatory Visit: Payer: Self-pay | Admitting: Licensed Clinical Social Worker

## 2021-07-09 ENCOUNTER — Encounter: Payer: Self-pay | Admitting: Oncology

## 2021-07-09 ENCOUNTER — Other Ambulatory Visit: Payer: Self-pay

## 2021-07-09 ENCOUNTER — Inpatient Hospital Stay: Payer: Self-pay | Attending: Oncology | Admitting: Oncology

## 2021-07-09 VITALS — BP 125/80 | HR 76 | Temp 96.7°F | Wt 143.0 lb

## 2021-07-09 DIAGNOSIS — M542 Cervicalgia: Secondary | ICD-10-CM | POA: Insufficient documentation

## 2021-07-09 DIAGNOSIS — E038 Other specified hypothyroidism: Secondary | ICD-10-CM | POA: Insufficient documentation

## 2021-07-09 DIAGNOSIS — Z8269 Family history of other diseases of the musculoskeletal system and connective tissue: Secondary | ICD-10-CM | POA: Insufficient documentation

## 2021-07-09 DIAGNOSIS — R233 Spontaneous ecchymoses: Secondary | ICD-10-CM

## 2021-07-09 DIAGNOSIS — Z880 Allergy status to penicillin: Secondary | ICD-10-CM | POA: Insufficient documentation

## 2021-07-09 DIAGNOSIS — Z8249 Family history of ischemic heart disease and other diseases of the circulatory system: Secondary | ICD-10-CM | POA: Insufficient documentation

## 2021-07-09 DIAGNOSIS — Z823 Family history of stroke: Secondary | ICD-10-CM | POA: Insufficient documentation

## 2021-07-09 DIAGNOSIS — R5383 Other fatigue: Secondary | ICD-10-CM | POA: Insufficient documentation

## 2021-07-09 DIAGNOSIS — Z836 Family history of other diseases of the respiratory system: Secondary | ICD-10-CM | POA: Insufficient documentation

## 2021-07-09 DIAGNOSIS — R591 Generalized enlarged lymph nodes: Secondary | ICD-10-CM | POA: Insufficient documentation

## 2021-07-09 DIAGNOSIS — R61 Generalized hyperhidrosis: Secondary | ICD-10-CM | POA: Insufficient documentation

## 2021-07-09 DIAGNOSIS — Z79899 Other long term (current) drug therapy: Secondary | ICD-10-CM | POA: Insufficient documentation

## 2021-07-09 DIAGNOSIS — Z8041 Family history of malignant neoplasm of ovary: Secondary | ICD-10-CM | POA: Insufficient documentation

## 2021-07-09 DIAGNOSIS — R519 Headache, unspecified: Secondary | ICD-10-CM | POA: Insufficient documentation

## 2021-07-09 DIAGNOSIS — R040 Epistaxis: Secondary | ICD-10-CM | POA: Insufficient documentation

## 2021-07-09 DIAGNOSIS — Z8051 Family history of malignant neoplasm of kidney: Secondary | ICD-10-CM | POA: Insufficient documentation

## 2021-07-09 DIAGNOSIS — G4700 Insomnia, unspecified: Secondary | ICD-10-CM | POA: Insufficient documentation

## 2021-07-09 DIAGNOSIS — Z8349 Family history of other endocrine, nutritional and metabolic diseases: Secondary | ICD-10-CM | POA: Insufficient documentation

## 2021-07-09 DIAGNOSIS — Z803 Family history of malignant neoplasm of breast: Secondary | ICD-10-CM | POA: Insufficient documentation

## 2021-07-09 NOTE — Progress Notes (Signed)
Hematology/Oncology Progress note  [phone] [phone]   Patient Care Team: Milana Huntsman, NP as PCP - General (Nurse Practitioner) Jim Like, RN as Registered Nurse Scarlett Presto, RN as Registered Nurse Federico Flake, MD as Consulting Physician (Obstetrics and Gynecology)  REFERRING PROVIDER: Milana Huntsman, NP  CHIEF COMPLAINTS/REASON FOR VISIT:  To review lab results.  HISTORY OF PRESENTING ILLNESS:   Brittany Cardenas is a  30 y.o.  female with PMH listed below was seen in consultation at the request of  Milana Huntsman, NP  for evaluation of lymphadenopathy Patient recently had MRI cervical spine without contrast done for evaluation of neck pain MRI was done [facility]. There was shallow central disc protrusion at C3-C4-C6-C7 disc levels with ventral thecal sac effacement.  No high-grade neural effacement.  Mildly prominent lymph nodes along the jugular chain bilaterally, favored to be benign and reactive.  Patient has multiple complaints today.  Reports feeling extremely tired, easy bruising.  She showed me pictures of intermittent epistaxis and also bruising on her lower extremities.  Also reports night sweats.  She is not able to give me an estimate of duration of her symptoms. + Insomnia and frequent headache.  Decreased appetite. She also report losing about 10 kg within the past 12 months.  INTERVAL HISTORY Brittany Cardenas is a 30 y.o. female who has above history reviewed by me today presents for follow up visit to review lab work-up results and management plan.   Patient continues to feel tired and fatigued. Comparing to her weight documented on 06/04/2021 and 06/18/2021, she has gained 2 pounds.   She has a sensation of throat tightness, she feels the lymph nodes are pressing on her throat.  Review of Systems  Constitutional:  Positive for appetite change, fatigue and unexpected weight change. Negative for  chills and fever.  HENT:   Negative for hearing loss and voice change.   Eyes:  Negative for eye problems.  Respiratory:  Negative for chest tightness and cough.   Cardiovascular:  Negative for chest pain.  Gastrointestinal:  Negative for abdominal distention, abdominal pain and blood in stool.  Endocrine: Negative for hot flashes.  Genitourinary:  Negative for difficulty urinating and frequency.   Musculoskeletal:  Positive for neck pain. Negative for arthralgias.  Skin:  Negative for itching and rash.  Neurological:  Negative for extremity weakness.  Hematological:  Negative for adenopathy. Bruises/bleeds easily.  Psychiatric/Behavioral:  Negative for confusion.    MEDICAL HISTORY:  Past Medical History:  Diagnosis Date   Allergy    Asthma    Patient reported   Constipation    Herpes simplex virus (HSV) infection    Patient reported   Migraine    Ovarian cyst 2012   per patient report    SURGICAL HISTORY: Past Surgical History:  Procedure Laterality Date   NO PAST SURGERIES      SOCIAL HISTORY: Social History   Socioeconomic History   Marital status: Divorced    Spouse name: NA   Number of children: 1   Years of education: 16   Highest education level: Bachelor's degree (e.g., BA, AB, BS)  Occupational History   Occupation: Employed  Tobacco Use   Smoking status: Never   Smokeless tobacco: Never  Vaping Use   Vaping Use: Never used  Substance and Sexual Activity   Alcohol use: Yes    Comment: socially/ occasional   Drug use: Never   Sexual activity: Not  Currently    Birth control/protection: I.U.D.    Comment: paraguard   Other Topics Concern   Not on file  Social History Narrative   Patient is a single mom, who lives with her mom, sister and niece. She is employed, and has a good support system with family and friends.     Social Determinants of Health   Financial Resource Strain: Not on file  Food Insecurity: No Food Insecurity   Worried About  Programme researcher, broadcasting/film/video in the Last Year: Never true   Ran Out of Food in the Last Year: Never true  Transportation Needs: No Transportation Needs   Lack of Transportation (Medical): No   Lack of Transportation (Non-Medical): No  Physical Activity: Not on file  Stress: Not on file  Social Connections: Not on file  Intimate Partner Violence: Not on file    FAMILY HISTORY: Family History  Problem Relation Age of Onset   Hypertension Mother    Asthma Mother    Stroke Mother    Migraines Mother    Other Father        murdered   Scoliosis Father    Migraines Sister    Hyperlipidemia Maternal Grandmother    Hypertension Maternal Grandmother    Varicose Veins Maternal Grandmother    Cancer Maternal Grandfather        kidney cancer   Ovarian cancer Paternal Grandmother    Breast cancer Paternal Grandmother 30   Cancer Paternal Grandmother        ovarian cancer    ALLERGIES:  is allergic to penicillin g, penicillins, pollen extract, shellfish allergy, and latex.  MEDICATIONS:  Current Outpatient Medications  Medication Sig Dispense Refill   acyclovir (ZOVIRAX) 800 MG tablet Take 1 tablet (800 mg total) by mouth daily. 30 tablet 12   albuterol (VENTOLIN HFA) 108 (90 Base) MCG/ACT inhaler INHALE 2 PUFFS INTO THE LUNGS EVERY 6 HOURS AS NEEDED FOR WHEEZING OR SHORT OF BREATH 6.7 g 3   aspirin-acetaminophen-caffeine (EXCEDRIN MIGRAINE) 250-250-65 MG tablet Take 1 tablet by mouth daily as needed for headache or migraine. Usually during menses     fluconazole (DIFLUCAN) 150 MG tablet Take 1 tablet (150 mg total) by mouth daily. 2 tablet 0   loratadine (CLARITIN) 10 MG tablet Take 10 mg by mouth daily.     meloxicam (MOBIC) 15 MG tablet meloxicam 15 mg tablet  Take 1 tablet every day by oral route as needed.     methocarbamol (ROBAXIN) 500 MG tablet Take 500 mg by mouth 2 (two) times daily.     PARAGARD INTRAUTERINE COPPER IU by Intrauterine route. Placed ~2018 per pt.     SUMAtriptan  (IMITREX) 50 MG tablet TAKE ONE TABLET BY MOUTH ONCE FOR 1 DOSE, MAY REPEAT IN 2 HOURS IF HEADACHE PERSISTS OR RECURS 9 tablet 0   No current facility-administered medications for this visit.     PHYSICAL EXAMINATION: ECOG PERFORMANCE STATUS: 1 - Symptomatic but completely ambulatory Vitals:   07/09/21 0956  BP: 125/80  Pulse: 76  Temp: (!) 96.7 F (35.9 C)   Filed Weights   07/09/21 0956  Weight: 143 lb (64.9 kg)    Physical Exam Constitutional:      General: She is not in acute distress. HENT:     Head: Normocephalic and atraumatic.  Eyes:     General: No scleral icterus. Cardiovascular:     Rate and Rhythm: Normal rate and regular rhythm.     Heart sounds: Normal heart  sounds.  Pulmonary:     Effort: Pulmonary effort is normal. No respiratory distress.     Breath sounds: No wheezing.  Abdominal:     General: Bowel sounds are normal. There is no distension.     Palpations: Abdomen is soft.  Musculoskeletal:        General: No deformity. Normal range of motion.     Cervical back: Normal range of motion and neck supple.  Skin:    General: Skin is warm and dry.     Findings: No erythema or rash.  Neurological:     Mental Status: She is alert and oriented to person, place, and time. Mental status is at baseline.     Cranial Nerves: No cranial nerve deficit.     Coordination: Coordination normal.  Psychiatric:        Mood and Affect: Mood normal.    LABORATORY DATA:  I have reviewed the data as listed Lab Results  Component Value Date   WBC 5.1 06/18/2021   HGB 14.1 06/18/2021   HCT 42.3 06/18/2021   MCV 90.2 06/18/2021   PLT 292 06/18/2021   Recent Labs    09/25/20 1011 06/18/21 1126  NA 143 137  K 4.1 3.8  CL 104 103  CO2 24 27  GLUCOSE 74 91  BUN 13 13  CREATININE 0.67 0.61  CALCIUM 9.1 9.3  GFRNONAA 118 >60  GFRAA 136  --   PROT 7.0 8.0  ALBUMIN 4.1 4.4  AST 19 26  ALT 16 22  ALKPHOS 76 59  BILITOT <0.2 0.5    Iron/TIBC/Ferritin/  %Sat    Component Value Date/Time   IRON 72 06/18/2021 1126   TIBC 431 06/18/2021 1126   FERRITIN 38 06/18/2021 1126   IRONPCTSAT 17 06/18/2021 1126       RADIOGRAPHIC STUDIES: I have personally reviewed the radiological images as listed and agreed with the findings in the report. No results found.    ASSESSMENT & PLAN:  1. Other fatigue   2. Night sweats   3. Easy bruising   4. Lymphadenopathy    #lymphadenopathy, patient has very mild prominent lymph nodes along the jugular vein chains which could be reactive.  On physical Examination, I am not able to appreciate any lymph node adenopathy. Labs reviewed and discussed with patient. CBC showed no leukocytosis, anemia, thrombocytopenia, normal LDH, peripheral blood flow cytometry was negative for immunophenotypic abnormality.  ANA is negative.  Her weight appears to be stable, slightly increased within the past few weeks.  I will hold off additional work-up.  Discussed with patient that the mild lymphadenopathy is not big enough to cause her throat sensation changes.  I recommend patient to further discuss with her primary care provider to see if she will need ENT/gastroenterology evaluation.  #Night sweats, QuantiFERON-TB negative #Easy bruising, von Willebrand panel is not consistent with von Willebrand disease.  PT and PTT were both normal. #Subclinical hypothyroidism, TSH is elevated mildly.  Normal free T4.  This may contribute to her fatigue symptoms.  I will defer to her primary care provider for management.  Recommend patient to further discuss with PCP.  She agrees with the plan.  Patient will be discharged from our clinic.  Continue follow-up with PCP. All questions were answered. The patient knows to call the clinic with any problems questions or concerns.  cc Milana Huntsman, NP    Return of visit: 2 to 3 weeks to go over results.   Rickard Patience, MD,  PhD  07/09/2021

## 2021-07-16 ENCOUNTER — Telehealth: Payer: Self-pay

## 2021-07-16 NOTE — Telephone Encounter (Signed)
Telephone call to patient today regarding the need for a repeat PAP due 06-2021 and physical.  Left a message to return my call at [phone].  PAP letter mailed today as well. Hart Carwin, RN

## 2021-07-24 ENCOUNTER — Ambulatory Visit: Payer: Self-pay | Admitting: Gerontology

## 2021-07-29 ENCOUNTER — Encounter: Payer: Self-pay | Admitting: Emergency Medicine

## 2021-07-29 ENCOUNTER — Emergency Department
Admission: EM | Admit: 2021-07-29 | Discharge: 2021-07-29 | Disposition: A | Discharge status: Home / Self Care | Payer: Self-pay | Attending: Emergency Medicine | Admitting: Emergency Medicine

## 2021-07-29 ENCOUNTER — Other Ambulatory Visit: Payer: Self-pay

## 2021-07-29 DIAGNOSIS — J45909 Unspecified asthma, uncomplicated: Secondary | ICD-10-CM | POA: Insufficient documentation

## 2021-07-29 DIAGNOSIS — T23249A Burn of second degree of unspecified multiple fingers (nail), including thumb, initial encounter: Secondary | ICD-10-CM | POA: Insufficient documentation

## 2021-07-29 DIAGNOSIS — T22211A Burn of second degree of right forearm, initial encounter: Secondary | ICD-10-CM | POA: Insufficient documentation

## 2021-07-29 DIAGNOSIS — Z23 Encounter for immunization: Secondary | ICD-10-CM | POA: Insufficient documentation

## 2021-07-29 DIAGNOSIS — Z7951 Long term (current) use of inhaled steroids: Secondary | ICD-10-CM | POA: Insufficient documentation

## 2021-07-29 DIAGNOSIS — X19XXXA Contact with other heat and hot substances, initial encounter: Secondary | ICD-10-CM | POA: Insufficient documentation

## 2021-07-29 DIAGNOSIS — Z7982 Long term (current) use of aspirin: Secondary | ICD-10-CM | POA: Insufficient documentation

## 2021-07-29 DIAGNOSIS — T23229A Burn of second degree of unspecified single finger (nail) except thumb, initial encounter: Secondary | ICD-10-CM

## 2021-07-29 MED ORDER — BACITRACIN ZINC 500 UNIT/GM EX OINT
TOPICAL_OINTMENT | Freq: Once | CUTANEOUS | Status: AC
  Filled 2021-07-29: qty 2.7

## 2021-07-29 MED ORDER — TETANUS-DIPHTH-ACELL PERTUSSIS 5-2.5-18.5 LF-MCG/0.5 IM SUSY
0.5000 mL | PREFILLED_SYRINGE | Freq: Once | INTRAMUSCULAR | Status: AC
  Administered 2021-07-29: 0.5 mL via INTRAMUSCULAR
  Filled 2021-07-29: qty 0.5

## 2021-07-29 MED ORDER — OXYCODONE-ACETAMINOPHEN 5-325 MG PO TABS: 1.0000 | ORAL_TABLET | ORAL | 0 refills | Status: DC | PRN

## 2021-07-29 MED ORDER — BACITRACIN-NEOMYCIN-POLYMYXIN OINTMENT TUBE
TOPICAL_OINTMENT | Freq: Every day | CUTANEOUS | Status: DC
  Filled 2021-07-29: qty 14.17

## 2021-07-29 MED ORDER — CEPHALEXIN 500 MG PO CAPS: 500.0000 mg | ORAL_CAPSULE | Freq: Four times a day (QID) | ORAL | 0 refills | Status: AC
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
## 2021-08-01 ENCOUNTER — Ambulatory Visit (LOCAL_COMMUNITY_HEALTH_CENTER): Payer: Self-pay | Admitting: Family Medicine

## 2021-08-01 ENCOUNTER — Other Ambulatory Visit: Payer: Self-pay

## 2021-08-01 ENCOUNTER — Encounter: Payer: Self-pay | Admitting: Family Medicine

## 2021-08-01 VITALS — BP 103/72 | HR 74 | Ht 64.0 in | Wt 150.0 lb

## 2021-08-01 DIAGNOSIS — Z01419 Encounter for gynecological examination (general) (routine) without abnormal findings: Secondary | ICD-10-CM

## 2021-08-01 DIAGNOSIS — Z3009 Encounter for other general counseling and advice on contraception: Secondary | ICD-10-CM

## 2021-08-01 DIAGNOSIS — B9689 Other specified bacterial agents as the cause of diseases classified elsewhere: Secondary | ICD-10-CM

## 2021-08-01 DIAGNOSIS — R87619 Unspecified abnormal cytological findings in specimens from cervix uteri: Secondary | ICD-10-CM

## 2021-08-01 DIAGNOSIS — Z113 Encounter for screening for infections with a predominantly sexual mode of transmission: Secondary | ICD-10-CM

## 2021-08-01 DIAGNOSIS — N760 Acute vaginitis: Secondary | ICD-10-CM

## 2021-08-01 LAB — WET PREP FOR TRICH, YEAST, CLUE
Trichomonas Exam: NEGATIVE
Yeast Exam: NEGATIVE

## 2021-08-01 MED ORDER — METRONIDAZOLE 500 MG PO TABS: 500.0000 mg | ORAL_TABLET | Freq: Two times a day (BID) | ORAL | 0 refills | Status: DC

## 2021-08-01 NOTE — Patient Instructions (Signed)
Natural Remedies for Bacterial Vaginosis  Option #1 1 Tbsp Fractitionated Coconut Oil 10 drops of Melaleuca (Tea Tree) Oil  Mix ingredients together well.  Soak 3-4 tampons (in applicators) in that mixture until all or mostly all mixture is soaked up into the tampons.  Insert 1 saturated tampon vaginally and wear overnight for 3-4 nights.    Option #2 (sometimes to be used in conjunction with option #1) Fill tub with enough to cover lap/lower abdomen warm water.  Mix 1/2 cup of baking soda in water.  Soak in water/baking soda mixture for at least 20 minutes.  Be sure to swish water in between legs to get as much in vagina as possible.  This soak should be done after sexual intercourse and menstrual cycles.     Option #3 (sometimes to be used in conjunction with option #1 and 2) Fill tub with enough to cover lap/lower abdomen warm water.  Mix 2-4 cup of apple cider vinegar in water.  Soak in water/vinegar mixture for at least 20 minutes.  Be sure to swish water in between legs to get as much in vagina as possible.  This soak should be done after sexual intercourse and menstrual cycles.    ----------------------------------------------  Remedios naturales para la vaginosis bacteriana  Opcin #1 1 cucharada de aceite de coco fractiionado 10 gotas de aceite de Melaleuca (rbol de t)  Mezclar bien los ingredientes.  Remoje 3-4 tampones (en aplicadores) en esa mezcla hasta que toda o casi toda la mezcla est absorbida en los tampones.  Inserte 1 tampn saturado por va vaginal y selo durante la noche durante 3-4 noches.    Opcin #2 (a veces para ser usada junto con la opcin #1) Llene la baera con suficiente para cubrir el agua tibia del regazo / abdomen inferior.  Mezcle 1/2 taza de bicarbonato de sodio en agua.  Remoje en agua / mezcla de bicarbonato de sodio durante al menos 20 minutos.  Asegrese de hacer buches con agua entre las piernas para obtener la mayor cantidad posible en la  vagina.  Este remojo debe hacerse despus de las relaciones sexuales y los Occupational hygienist.    Opcin #3 (a veces para ser usada junto con la opcin #1 y 2) Llene la baera con suficiente para cubrir el agua tibia del regazo / abdomen inferior.  Mezcle 2-4 tazas de vinagre de sidra de [employer].  Remoje en la mezcla de agua / vinagre durante al menos 20 minutos.  Asegrese de hacer buches con agua entre las piernas para obtener la mayor cantidad posible en la vagina.  Este remojo debe hacerse despus de las relaciones sexuales y los Occupational hygienist.

## 2021-08-01 NOTE — Progress Notes (Signed)
Wet prep reviewed by MD and treated for BV per Dr. Alvester Morin order. Jossie Ng, RN

## 2021-08-01 NOTE — Progress Notes (Addendum)
[facility] DEPARTMENT [facility] [address] Main Number: [phone]  Family Planning Visit- Repeat Yearly Visit  Subjective:  Milli Woolridge is a 30 y.o. G2P1011  being seen today for an annual wellness visit and to discuss contraception options.   The patient is currently using IUD or IUS for pregnancy prevention. Patient does not want a pregnancy in the next year. Patient has the following medical problems: has Cervical dysplasia; HSV infection; History of asthma; History of migraine; History of constipation; Encounter to establish care; Elevated heart rate with elevated blood pressure without diagnosis of hypertension; Tonsillitis with exudate; Feeling worried; Enlarged lymph node; Vaginal discharge; Easy bruising; Epistaxis; and Other fatigue on their problem list.  Chief Complaint  Patient presents with   Abnormal Pap Smear   PAP    Patient reports Multiple positive on form ROS-- has PCP visit. No red flags Last pap in 2021= ASCUS HPV POS [place]  06/2020= no dysplasia    See flowsheet for other program required questions.   Body mass index is 25.75 kg/m. - Patient is eligible for diabetes screening based on BMI and age >31?  no HA1C ordered? not applicable  Patient reports 1 of partners in last year. Desires STI screening?  Yes   Has patient been screened once for HCV in the past?  Yes  No results found for: HCVAB  Does the patient have current of drug use, have a partner with drug use, and/or has been incarcerated since last result? No  If yes-- Screen for HCV through [facility]   Does the patient meet criteria for HBV testing? No  Criteria:  -Household, sexual or needle sharing contact with HBV -History of drug use -HIV positive -Those with known Hep C   Health Maintenance Due  Topic Date Due   Pneumococcal Vaccine 85-52 Years old (1 - PCV) Never done   Hepatitis C Screening  Never done   COVID-19  Vaccine (3 - Pfizer risk series) 01/30/2020   INFLUENZA VACCINE  Never done   PAP SMEAR-Modifier  05/24/2021    ROS + heat/cold intolerance +dizziness +double vision +LAD in neck +vaginal fluid +easy bleeding and bruising +headaches +inflamation +nausea +rashes   The following portions of the patient's history were reviewed and updated as appropriate: allergies, current medications, past family history, past medical history, past social history, past surgical history and problem list. Problem list updated.  Objective:   Vitals:   08/01/21 1100  BP: 103/72  Pulse: 74  Weight: 150 lb (68 kg)  Height: 5\' 4"  (1.626 m)    Physical Exam Vitals and nursing note reviewed.  Constitutional:      Appearance: Normal appearance.  HENT:     Head: Normocephalic.  Pulmonary:     Effort: Pulmonary effort is normal.  Genitourinary:    General: Normal vulva.     Exam position: Lithotomy position.     Pubic Area: No rash.      Labia:        Right: No rash.        Left: No rash.      Vagina: Vaginal discharge (yellow, mild odor, pH > 4.5) present.     Cervix: Normal.     Uterus: Normal.      Musculoskeletal:        General: Normal range of motion.  Skin:    General: Skin is warm and dry.  Neurological:     Mental Status: She is  alert. Mental status is at baseline.      Assessment and Plan:  Brittany Cardenas is a 30 y.o. female G2P1011 presenting to the [facility] Department for an yearly wellness and contraception visit  Contraception counseling: Reviewed all forms of birth control options in the tiered based approach. available including abstinence; over the counter/barrier methods; hormonal contraceptive medication including pill, patch, ring, injection,contraceptive implant, ECP; hormonal and nonhormonal IUDs; permanent sterilization options including vasectomy and the various tubal sterilization modalities. Risks, benefits, and typical  effectiveness rates were reviewed.  Questions were answered.  Written information was also given to the patient to review.  Patient desires to keep CU IUD, this was prescribed for patient. She will follow up in  1 year for surveillance.   Told to call with any further questions, or with any concerns about this method of contraception.  Emphasized use of condoms 100% of the time for STI prevention.  ECP not needed due to Cu IUD   1. Abnormal cervical Papanicolaou smear, unspecified abnormal pap finding - IGP, Aptima HPV, rfx 16/18,45  2. Screening examination for venereal disease - Chlamydia/Gonorrhea [place] [facility] - HIV/HCV [facility]===> later changed to HIV only due to lab not drawing HCV - Syphilis Serology, [facility] - Wet Mount - Treat wet mount per standing orders   Return if symptoms worsen or fail to improve.  Future Appointments  Date Time Provider Department Center  09/18/2021 10:00 AM Rolm Gala, NP [facility] None    Federico Flake, MD

## 2021-08-06 LAB — IGP, APTIMA HPV, RFX 16/18,45
HPV Aptima: POSITIVE — AB
PAP Smear Comment: 0

## 2021-08-06 LAB — HPV GENOTYPES 16/18,45
HPV Genotype 16: NEGATIVE
HPV Genotype 18,45: NEGATIVE

## 2021-08-13 ENCOUNTER — Telehealth: Payer: Self-pay

## 2021-08-13 NOTE — Telephone Encounter (Signed)
Client with appt 08/01/2021 and HIV/HCV ordered. HCV on state lab form not marked and therefore testing was not done. Call to client and explained above. Appf for HCV only testing scheduled for 08/29/2021. Jossie Ng, RN

## 2021-08-28 ENCOUNTER — Emergency Department
Admission: EM | Admit: 2021-08-28 | Discharge: 2021-08-28 | Disposition: A | Discharge status: Home / Self Care | Payer: Self-pay | Attending: Emergency Medicine | Admitting: Emergency Medicine

## 2021-08-28 ENCOUNTER — Other Ambulatory Visit: Payer: Self-pay

## 2021-08-28 DIAGNOSIS — U071 COVID-19: Secondary | ICD-10-CM | POA: Insufficient documentation

## 2021-08-28 LAB — RESP PANEL BY RT-PCR (FLU A&B, COVID) ARPGX2
Influenza A by PCR: NEGATIVE
Influenza B by PCR: NEGATIVE
SARS Coronavirus 2 by RT PCR: POSITIVE — AB

## 2021-08-28 MED ORDER — CETIRIZINE HCL 10 MG PO TABS: 10.0000 mg | ORAL_TABLET | Freq: Every day | ORAL | 0 refills | Status: DC

## 2021-08-28 MED ORDER — ALBUTEROL SULFATE HFA 108 (90 BASE) MCG/ACT IN AERS: 2.0000 | INHALATION_SPRAY | Freq: Four times a day (QID) | RESPIRATORY_TRACT | 2 refills | Status: DC | PRN

## 2021-08-28 MED ORDER — ACETAMINOPHEN 325 MG PO TABS
650.0000 mg | ORAL_TABLET | Freq: Once | ORAL | Status: AC
  Administered 2021-08-28: 650 mg via ORAL
  Filled 2021-08-28: qty 2

## 2021-08-28 MED ORDER — BENZONATATE 100 MG PO CAPS: 100.0000 mg | ORAL_CAPSULE | Freq: Three times a day (TID) | ORAL | 0 refills | Status: AC | PRN

## 2021-08-28 NOTE — ED Provider Notes (Signed)
[facility] Provider Note  Patient Contact: 3:59 PM (approximate)   History   Cough   HPI  Brittany Cardenas is a 31 y.o. female presents to the emergency department with fever, cough, nasal congestion and headache.  Patient's ex-husband and daughter have similar symptoms at home.  No chest pain, chest tightness or abdominal pain.      Physical Exam   Triage Vital Signs: ED Triage Vitals  Enc Vitals Group     BP 08/28/21 1313 128/90     Pulse Rate 08/28/21 1313 86     Resp 08/28/21 1313 20     Temp 08/28/21 1313 (!) 101.1 F (38.4 C)     Temp Source 08/28/21 1313 Oral     SpO2 08/28/21 1313 96 %     Weight 08/28/21 1314 148 lb (67.1 kg)     Height 08/28/21 1314 5\' 4"  (1.626 m)     Head Circumference --      Peak Flow --      Pain Score 08/28/21 1314 10     Pain Loc --      Pain Edu? --      Excl. in GC? --     Most recent vital signs: Vitals:   08/28/21 1313  BP: 128/90  Pulse: 86  Resp: 20  Temp: (!) 101.1 F (38.4 C)  SpO2: 96%    Constitutional: Alert and oriented. Patient is lying supine. Eyes: Conjunctivae are normal. PERRL. EOMI. Head: Atraumatic. ENT:      Ears: Tympanic membranes are mildly injected with mild effusion bilaterally.       Nose: No congestion/rhinnorhea.      Mouth/Throat: Mucous membranes are moist. Posterior pharynx is mildly erythematous.  Hematological/Lymphatic/Immunilogical: No cervical lymphadenopathy.  Cardiovascular: Normal rate, regular rhythm. Normal S1 and S2.  Good peripheral circulation. Respiratory: Normal respiratory effort without tachypnea or retractions. Lungs CTAB. Good air entry to the bases with no decreased or absent breath sounds. Gastrointestinal: Bowel sounds 4 quadrants. Soft and nontender to palpation. No guarding or rigidity. No palpable masses. No distention. No CVA tenderness. Musculoskeletal: Full range of motion to all extremities. No gross deformities  appreciated. Neurologic:  Normal speech and language. No gross focal neurologic deficits are appreciated.  Skin:  Skin is warm, dry and intact. No rash noted. Psychiatric: Mood and affect are normal. Speech and behavior are normal. Patient exhibits appropriate insight and judgement.    ED Results / Procedures / Treatments   Labs (all labs ordered are listed, but only abnormal results are displayed) Labs Reviewed  RESP PANEL BY RT-PCR (FLU A&B, COVID) [id number] - Abnormal; Notable for the following components:      Result Value   SARS Coronavirus 2 by RT PCR POSITIVE (*)    All other components within normal limits       MEDICATIONS ORDERED IN ED: Medications  acetaminophen (TYLENOL) tablet 650 mg (650 mg Oral Given 08/28/21 1325)     IMPRESSION / MDM / ASSESSMENT AND PLAN / ED COURSE  I reviewed the triage vital signs and the nursing notes.                              Assessment and Plan: Covid 19 Differential diagnosis includes, but is not limited to, COVID-19, influenza...  31 year old female presents to the emergency department with cough and nasal congestion since January 3.  She had a positive at home  COVID-19 test.  She was febrile at triage but vital signs were otherwise reassuring.   COVID-19 and influenza A/B testing were ordered in the emergency department and patient tested positive for COVID-19.  She was discharged with Zyrtec, and albuterol inhaler and Tessalon Perles.  Tylenol and ibuprofen alternating were recommended for fever.  Return precautions were given to return with new or worsening symptoms.      FINAL CLINICAL IMPRESSION(S) / ED DIAGNOSES   Final diagnoses:  COVID-19     Rx / DC Orders   ED Discharge Orders          Ordered    benzonatate (TESSALON PERLES) 100 MG capsule  3 times daily PRN        08/28/21 1539    cetirizine (ZYRTEC ALLERGY) 10 MG tablet  Daily        08/28/21 1539    albuterol (VENTOLIN HFA) 108 (90 Base) MCG/ACT  inhaler  Every 6 hours PRN        08/28/21 1540             Note:  This document was prepared using Dragon voice recognition software and may include unintentional dictation errors.   Pia Mau [place], PA-C 08/28/21 1603    Delton [place], MD 08/28/21 2325

## 2021-08-28 NOTE — Discharge Instructions (Addendum)
Take Zyrtec once daily. Take two Tessalon Perles at night before bed.  Take 2 puffs of Albuterol every four hours as needed for wheezing.

## 2021-08-28 NOTE — ED Triage Notes (Addendum)
Pt in with co cough and congestion since Jan 3rd, every one at home has same symptoms. Has positive covid test at home.

## 2021-08-29 ENCOUNTER — Other Ambulatory Visit: Payer: Self-pay

## 2021-09-05 ENCOUNTER — Telehealth: Payer: Self-pay

## 2021-09-05 NOTE — Telephone Encounter (Signed)
When client was at recent appt, RN failed to order HCV and only marked form for HIV. Client was called and appt for HCV testing. Day before appt, client was diagnosed with Covid and unable to keep appt. Call to client today and appt for HCV testing only scheduled in [facility] next week. Rich Number, RN

## 2021-09-09 NOTE — Addendum Note (Signed)
Addended by: Geanie Berlin on: 09/09/2021 11:09 AM   Modules accepted: Orders

## 2021-09-12 ENCOUNTER — Other Ambulatory Visit: Payer: Self-pay

## 2021-09-12 NOTE — Telephone Encounter (Signed)
Per secure chat from [place] in [facility], client did not keep appt today for HCV only testing. Rich Number, RN

## 2021-09-18 ENCOUNTER — Ambulatory Visit: Payer: Self-pay | Admitting: Gerontology

## 2021-10-01 ENCOUNTER — Ambulatory Visit: Payer: Self-pay | Admitting: Licensed Clinical Social Worker

## 2021-10-01 DIAGNOSIS — F4323 Adjustment disorder with mixed anxiety and depressed mood: Secondary | ICD-10-CM

## 2021-10-01 NOTE — Progress Notes (Signed)
Counselor/Therapist Progress Note  Patient ID: Brittany Cardenas, MRN: [id number],    Date: 10/01/2021  Time Spent: 45 minutes   Treatment Type: Psychotherapy Patient's child was present in session.   Reported Symptoms:  Anxiety, anxious thoughts, panic   Mental Status Exam:  Appearance:   Casual     Behavior:  Appropriate and Sharing  Motor:  Normal  Speech/Language:   Normal Rate  Affect:  Appropriate, Congruent, and Full Range  Mood:  normal  Thought process:  normal  Thought content:    WNL  Sensory/Perceptual disturbances:    WNL  Orientation:  oriented to person, place, time/date, and situation  Attention:  Good  Concentration:  Good  Memory:  WNL  Fund of knowledge:   Good  Insight:    Good  Judgment:   Good  Impulse Control:  Good   Risk Assessment: Danger to Self:  No Self-injurious Behavior: No Danger to Others: No Duty to Warn:no Physical Aggression / Violence:No  Access to Firearms a concern: No  Gang Involvement:No   Subjective: Patient was engaged and cooperative throughout the session using time effectively to discuss thoughts and feelings. Patient was receptive to feedback and intervention from LCSW. Patient is likely to benefit from future treatment because they remain motivated to decrease symptoms and improve functioning.   Interventions: Cognitive Behavioral Therapy, Mindfulness Meditation, and Psycho-education/Bibliotherapy Checked in with patient. Conducted brief assessment. Engaged patient in processing current psychosocial stressors parenting and co-parenting challenges, concerns about how father is parenting their child, and anxiety/stress. LCSW had to redirect patient to censor information discussed and to have boundaries on what was discussed in the session due to patient's child Taimmy being present in session. LCSW provided psychoeducation on mindfulness and lead both patient and her daughter in Pretzel Posture exercise. Provided support  through active listening, validation of feelings, and highlighted patient's strengths.    Diagnosis:   ICD-10-CM   1. Adjustment disorder with mixed anxiety and depressed mood  F43.23      Plan:  LCSW and patient to develop new plan at next session.   Future Appointments  Date Time Provider Department Center  10/02/2021  5:15 PM [facility] None  10/15/2021 10:00 AM Kathreen Cosier, LCSW [facility] None     Kathreen Cosier, LCSW

## 2021-10-02 ENCOUNTER — Ambulatory Visit: Payer: Self-pay | Admitting: Gerontology

## 2021-10-02 ENCOUNTER — Other Ambulatory Visit: Payer: Self-pay

## 2021-10-02 VITALS — BP 119/78 | HR 74 | Temp 97.9°F | Resp 16 | Ht 64.0 in | Wt 146.2 lb

## 2021-10-02 DIAGNOSIS — R5383 Other fatigue: Secondary | ICD-10-CM

## 2021-10-02 DIAGNOSIS — R599 Enlarged lymph nodes, unspecified: Secondary | ICD-10-CM

## 2021-10-02 DIAGNOSIS — R233 Spontaneous ecchymoses: Secondary | ICD-10-CM

## 2021-10-02 NOTE — Progress Notes (Signed)
Established Patient Office Visit  Subjective:  Patient ID: Brittany Cardenas, female    DOB: 1991-07-26  Age: 31 y.o. MRN: [id number]  CC:  Chief Complaint  Patient presents with   Follow-up   Lymphadenopathy    HPI Brittany Cardenas  is a 31 y/o female who has history of  Allergy, Asthma,Constipation, Herpes Simplex virus, Migraine, presents for multiple complaints. She presents with multiple complaints of feeling weird, increase dizziness, generalized multiple bruises , forgetfulness, an episode of nose bleed, increased fatigue, dysphoric mood, difficulty fallen and staying asleep. She was seen  on 07/09/21 by Dr Earlie Server  for lymphadenopathy, she was notified that lymphadenopathy is not big enough to cause her throat sensation. She denies feeling of globus, and dysphagia. She states that she's on light duty, not lifting more than 5 pounds. She denies chest pain, palpitation, fever, chills and had Covid infection on 08/28/21. Overall, she states tat she's worried about feeling tired all the time.  Past Medical History:  Diagnosis Date   Allergy    Asthma    Patient reported   Constipation    Herpes simplex virus (HSV) infection    Patient reported   Migraine    Ovarian cyst 2012   per patient report    Past Surgical History:  Procedure Laterality Date   Denies surgical hx     NO PAST SURGERIES      Family History  Problem Relation Age of Onset   Ovarian cancer Paternal Grandmother    Breast cancer Paternal Grandmother 30   Cancer Paternal 20        ovarian cancer   Hyperlipidemia Maternal Grandmother    Hypertension Maternal Grandmother    Varicose Veins Maternal Grandmother    Cancer Maternal Grandfather        kidney cancer   Other Father        murdered   Scoliosis Father    Hypertension Mother    Asthma Mother    Stroke Mother    Migraines Mother    Migraines Sister     Social History   Socioeconomic History   Marital status:  Divorced    Spouse name: NA   Number of children: 1   Years of education: 16   Highest education level: Bachelor's degree (e.g., BA, AB, BS)  Occupational History   Occupation: Employed   Occupation: Architect  Tobacco Use   Smoking status: Never    Passive exposure: Current   Smokeless tobacco: Never  Vaping Use   Vaping Use: Never used  Substance and Sexual Activity   Alcohol use: Yes    Comment: Last ETOH use 07/31/2021 - 1 beer (first ETOH use in one month).   Drug use: Never   Sexual activity: Not Currently    Partners: Male    Birth control/protection: I.U.D.    Comment: Paragard  Other Topics Concern   Not on file  Social History Narrative   Patient is a single mom, who lives with her mom, sister and niece. She is employed, and has a good support system with family and friends.     Social Determinants of Health   Financial Resource Strain: Not on file  Food Insecurity: No Food Insecurity   Worried About Charity fundraiser in the Last Year: Never true   Ran Out of Food in the Last Year: Never true  Transportation Needs: No Transportation Needs   Lack of Transportation (Medical): No  Lack of Transportation (Non-Medical): No  Physical Activity: Not on file  Stress: Not on file  Social Connections: Not on file  Intimate Partner Violence: Not At Risk   Fear of Current or Ex-Partner: No   Emotionally Abused: No   Physically Abused: No   Sexually Abused: No    Outpatient Medications Prior to Visit  Medication Sig Dispense Refill   acyclovir (ZOVIRAX) 800 MG tablet Take 1 tablet (800 mg total) by mouth daily. 30 tablet 12   albuterol (VENTOLIN HFA) 108 (90 Base) MCG/ACT inhaler Inhale 2 puffs into the lungs every 6 (six) hours as needed for wheezing or shortness of breath. 8 g 2   aspirin-acetaminophen-caffeine (EXCEDRIN MIGRAINE) 250-250-65 MG tablet Take 1 tablet by mouth daily as needed for headache or migraine. Usually during menses     loratadine  (CLARITIN) 10 MG tablet Take 10 mg by mouth daily.     meloxicam (MOBIC) 15 MG tablet meloxicam 15 mg tablet  Take 1 tablet every day by oral route as needed.     PARAGARD INTRAUTERINE COPPER IU by Intrauterine route. Placed ~2018 per pt.     tiZANidine (ZANAFLEX) 4 MG tablet tizanidine 4 mg tablet  Take 1 tablet by mouth once daily     SUMAtriptan (IMITREX) 50 MG tablet TAKE ONE TABLET BY MOUTH ONCE FOR 1 DOSE, MAY REPEAT IN 2 HOURS IF HEADACHE PERSISTS OR RECURS (Patient not taking: Reported on 10/02/2021) 9 tablet 0   cetirizine (ZYRTEC ALLERGY) 10 MG tablet Take 1 tablet (10 mg total) by mouth daily for 10 days. 30 tablet 0   fluconazole (DIFLUCAN) 150 MG tablet Take 1 tablet (150 mg total) by mouth daily. (Patient not taking: Reported on 08/01/2021) 2 tablet 0   methocarbamol (ROBAXIN) 500 MG tablet Take 500 mg by mouth 2 (two) times daily. (Patient not taking: Reported on 08/01/2021)     metroNIDAZOLE (FLAGYL) 500 MG tablet Take 1 tablet (500 mg total) by mouth 2 (two) times daily. 14 tablet 0   oxyCODONE-acetaminophen (PERCOCET) 5-325 MG tablet Take 1 tablet by mouth every 4 (four) hours as needed for severe pain. 12 tablet 0   No facility-administered medications prior to visit.    Allergies  Allergen Reactions   Shellfish Allergy Shortness Of Breath, Swelling, Rash and Other (See Comments)    erythema erythema   Penicillin G Itching   Penicillins Itching and Rash   Bee Pollen     Other reaction(s): Other (See Comments) Sneezing   Latex Rash   Pollen Extract Other (See Comments)    Sneezing    ROS Review of Systems  Constitutional:  Positive for fatigue. Negative for fever.  Respiratory: Negative.    Cardiovascular: Negative.   Skin:        Generalized bruises  Neurological:  Positive for dizziness.  Psychiatric/Behavioral:  Positive for dysphoric mood. Negative for sleep disturbance.      Objective:    Physical Exam HENT:     Head: Normocephalic and atraumatic.      Mouth/Throat:     Lips: Pink.     Mouth: Mucous membranes are moist.     Palate: No mass and lesions.     Pharynx: Oropharynx is clear. Uvula midline.     Tonsils: No tonsillar exudate or tonsillar abscesses.  Eyes:     Extraocular Movements: Extraocular movements intact.     Conjunctiva/sclera: Conjunctivae normal.     Pupils: Pupils are equal, round, and reactive to light.  Cardiovascular:  Rate and Rhythm: Normal rate and regular rhythm.     Pulses: Normal pulses.     Heart sounds: Normal heart sounds.  Pulmonary:     Effort: Pulmonary effort is normal.     Breath sounds: Normal breath sounds.  Skin:    General: Skin is warm.     Coloration: Jaundice: scattered bruises to arm,legs.     Findings: Bruising present.  Neurological:     General: No focal deficit present.     Mental Status: She is alert and oriented to person, place, and time. Mental status is at baseline.  Psychiatric:        Mood and Affect: Mood normal.        Behavior: Behavior normal.        Thought Content: Thought content normal.        Judgment: Judgment normal.    BP 119/78 (BP Location: Right Arm, Patient Position: Sitting, Cuff Size: Normal)    Pulse 74    Temp 97.9 F (36.6 C) (Oral)    Resp 16    Ht 5\' 4"  (1.626 m)    Wt 146 lb 3.2 oz (66.3 kg)    PF 97 L/min    BMI 25.10 kg/m  Wt Readings from Last 3 Encounters:  10/02/21 146 lb 3.2 oz (66.3 kg)  08/28/21 148 lb (67.1 kg)  08/01/21 150 lb (68 kg)     Health Maintenance Due  Topic Date Due   Hepatitis C Screening  Never done   COVID-19 Vaccine (3 - Pfizer risk series) 01/30/2020   INFLUENZA VACCINE  Never done    There are no preventive care reminders to display for this patient.  Lab Results  Component Value Date   TSH 1.530 10/02/2021   Lab Results  Component Value Date   WBC 6.9 10/02/2021   HGB 13.2 10/02/2021   HCT 39.1 10/02/2021   MCV 91 10/02/2021   PLT 275 10/02/2021   Lab Results  Component Value Date   NA 137  06/18/2021   K 3.8 06/18/2021   CO2 27 06/18/2021   GLUCOSE 91 06/18/2021   BUN 13 06/18/2021   CREATININE 0.61 06/18/2021   BILITOT 0.5 06/18/2021   ALKPHOS 59 06/18/2021   AST 26 06/18/2021   ALT 22 06/18/2021   PROT 8.0 06/18/2021   ALBUMIN 4.4 06/18/2021   CALCIUM 9.3 06/18/2021   ANIONGAP 7 06/18/2021   Lab Results  Component Value Date   CHOL 128 09/25/2020   Lab Results  Component Value Date   HDL 54 09/25/2020   Lab Results  Component Value Date   LDLCALC 59 09/25/2020   Lab Results  Component Value Date   TRIG 72 09/25/2020   Lab Results  Component Value Date   CHOLHDL 2.4 09/25/2020   No results found for: HGBA1C    Assessment & Plan:    1. Other fatigue - Fatigue unknown etiology, will check Vitamin D - Vitamin D (25 hydroxy); Future - Vitamin D (25 hydroxy)  2. Easy bruising -Will check cbc to rule in anemia, platelet count - CBC w/Diff; Future - CBC w/Diff  3. Enlarged lymph node - Will check TSH, depending on her labs, might consider referral to Hematology. - TSH     Follow-up: Return in about 1 month (around 10/30/2021), or if symptoms worsen or fail to improve.    Esequiel Kleinfelter Jerold Coombe, NP

## 2021-10-03 LAB — CBC WITH DIFFERENTIAL/PLATELET
Basophils Absolute: 0 10*3/uL (ref 0.0–0.2)
Basos: 1 %
EOS (ABSOLUTE): 0.2 10*3/uL (ref 0.0–0.4)
Eos: 2 %
Hematocrit: 39.1 % (ref 34.0–46.6)
Hemoglobin: 13.2 g/dL (ref 11.1–15.9)
Immature Grans (Abs): 0 10*3/uL (ref 0.0–0.1)
Immature Granulocytes: 0 %
Lymphocytes Absolute: 1.5 10*3/uL (ref 0.7–3.1)
Lymphs: 22 %
MCH: 30.6 pg (ref 26.6–33.0)
MCHC: 33.8 g/dL (ref 31.5–35.7)
MCV: 91 fL (ref 79–97)
Monocytes Absolute: 0.6 10*3/uL (ref 0.1–0.9)
Monocytes: 9 %
Neutrophils Absolute: 4.6 10*3/uL (ref 1.4–7.0)
Neutrophils: 66 %
Platelets: 275 10*3/uL (ref 150–450)
RBC: 4.32 x10E6/uL (ref 3.77–5.28)
RDW: 12.3 % (ref 11.7–15.4)
WBC: 6.9 10*3/uL (ref 3.4–10.8)

## 2021-10-03 LAB — TSH: TSH: 1.53 u[IU]/mL (ref 0.450–4.500)

## 2021-10-03 LAB — VITAMIN D 25 HYDROXY (VIT D DEFICIENCY, FRACTURES): Vit D, 25-Hydroxy: 19.7 ng/mL — ABNORMAL LOW (ref 30.0–100.0)

## 2021-10-07 ENCOUNTER — Other Ambulatory Visit: Payer: Self-pay | Admitting: Gerontology

## 2021-10-07 DIAGNOSIS — E559 Vitamin D deficiency, unspecified: Secondary | ICD-10-CM

## 2021-10-07 MED ORDER — VITAMIN D (ERGOCALCIFEROL) 1.25 MG (50000 UNIT) PO CAPS: 50000.0000 [IU] | ORAL_CAPSULE | ORAL | 0 refills | Status: DC

## 2021-10-15 ENCOUNTER — Ambulatory Visit: Payer: Self-pay | Admitting: Licensed Clinical Social Worker

## 2021-10-15 DIAGNOSIS — F4323 Adjustment disorder with mixed anxiety and depressed mood: Secondary | ICD-10-CM

## 2021-10-15 NOTE — Progress Notes (Signed)
Counselor/Therapist Progress Note  Patient ID: Brittany Cardenas, MRN: [id number],    Date: 10/15/2021  Time Spent: 44 minutes   Treatment Type: Psychotherapy  Reported Symptoms:  Anxiety, anxious thoughts  Mental Status Exam:  Appearance:   NA     Behavior:  Appropriate and Sharing  Motor:  NA  Speech/Language:   Normal Rate  Affect:  NA  Mood:  normal  Thought process:  normal  Thought content:    WNL  Sensory/Perceptual disturbances:    WNL  Orientation:  oriented to person, place, time/date, and situation  Attention:  Good  Concentration:  Good  Memory:  WNL  Fund of knowledge:   Good  Insight:    Good  Judgment:   Good  Impulse Control:  Good   Risk Assessment: Danger to Self:  No Self-injurious Behavior: No Danger to Others: No Duty to Warn:no Physical Aggression / Violence:No  Access to Firearms a concern: No  Gang Involvement:No   Subjective: Patient was engaged and cooperative throughout the session using time effectively to discuss thoughts, feelings, goal of treatment and treatment plan. Patient was receptive to feedback and intervention from LCSW. Patient is likely to benefit from future treatment because they remain motivated to decrease anxiety and improve functioning and reports benefit of regular sessions.       Interventions: Cognitive Behavioral Therapy Checked in with patient. Engaged patient in discussing goal of treatment and reviewed previous session, including assessment and goal of treatment. Reviewed CBTs. Explored patient's goal of treatment and worked collaboratively to develop CBT treatment plan to assist patient in meeting treatment goal. Reviewed thought reframing, and acceptance strategies to cope with anxiety. Provided support through active listening, validation of feelings, and highlighted patient's strengths.   Diagnosis:   ICD-10-CM   1. Adjustment disorder with mixed anxiety and depressed mood  F43.23      Plan: Patient  wants to learn to trust people again, not be so nervous and to be more comfortable with others and to be able to have successful relationships.  Treatment Target: Understand the relationship between thoughts, emotions, and behaviors  Psychoeducation on CBT model   Teach the connection between thoughts, emotions, and behaviors  Treatment Target: Increase realistic balanced thinking -to learn how to replace thinking with thoughts that are more accurate or helpful Explore patient's thoughts, beliefs, automatic thoughts, assumptions  Identify and replace unhelpful thinking patterns (upsetting ideas, self-talk and mental images) Process distress and allow for emotional release  Questioning and challenging thoughts Cognitive reappraisal  Restructuring, Socratic questioning  Treatment Target: Increase coping skills Mindfulness acceptance practices  Treatment Target: Increase comfortability in social situations  Fear Hierarchy  Identify Behavioral Experiments - beginning with small steps - patient's choice  imagined exposure Role Plays  Homework experiments chosen by patient    Future Appointments  Date Time Provider Department Center  10/29/2021 10:00 AM Kathreen Cosier, LCSW [facility] None  11/04/2021  9:00 AM Iloabachie, Francoise Ceo, NP [facility] None   Kathreen Cosier, LCSW

## 2021-10-21 ENCOUNTER — Ambulatory Visit
Admission: RE | Admit: 2021-10-21 | Discharge: 2021-10-21 | Disposition: A | Discharge status: Home / Self Care | Payer: Self-pay | Source: Ambulatory Visit | Attending: Emergency Medicine | Admitting: Emergency Medicine

## 2021-10-21 ENCOUNTER — Other Ambulatory Visit: Payer: Self-pay

## 2021-10-21 VITALS — BP 98/66 | HR 90 | Temp 99.8°F | Resp 16

## 2021-10-21 DIAGNOSIS — U071 COVID-19: Secondary | ICD-10-CM

## 2021-10-21 DIAGNOSIS — R599 Enlarged lymph nodes, unspecified: Secondary | ICD-10-CM

## 2021-10-21 DIAGNOSIS — R5383 Other fatigue: Secondary | ICD-10-CM

## 2021-10-21 DIAGNOSIS — J4520 Mild intermittent asthma, uncomplicated: Secondary | ICD-10-CM

## 2021-10-21 MED ORDER — AEROCHAMBER PLUS MISC: 2 refills | Status: AC

## 2021-10-21 MED ORDER — PREDNISONE 10 MG (21) PO TBPK: ORAL_TABLET | ORAL | 0 refills | Status: DC

## 2021-10-21 MED ORDER — ALBUTEROL SULFATE HFA 108 (90 BASE) MCG/ACT IN AERS: 1.0000 | INHALATION_SPRAY | Freq: Four times a day (QID) | RESPIRATORY_TRACT | 0 refills | Status: DC | PRN

## 2021-10-21 MED ORDER — FAMOTIDINE 20 MG PO TABS: 20.0000 mg | ORAL_TABLET | Freq: Two times a day (BID) | ORAL | 0 refills | Status: DC

## 2021-10-21 NOTE — ED Provider Notes (Signed)
HPI  SUBJECTIVE:  Brittany Cardenas is a 31 y.o. female who presents with 2 months of swollen cervical lymphadenopathy, dry cough, fatigue, shortness of breath with coughing spells.  She reports a choking sensation with swallowing solid foods, but has no problem with swallowing fluids.  States that she feels that her lymph nodes are pressing in on her neck.  She reports wheezing, dyspnea on exertion,  night sweats, occasional epistaxis, and had some chills yesterday.   She also reports early satiety.  Per patient, these symptoms started after having COVID in January.  No sore throat, burning chest pain, belching, waterbrash, fevers, unintentional weight loss, abdominal pain.  She used her albuterol inhaler 4 times yesterday and has been taking Zyrtec.  The albuterol helps.  Her symptoms are worse at night, with lying down and eating solids.  She had a negative HIV test 3 months ago, but has a new sexual partner.  She has a past medical history of asthma, allergies, vitamin D deficiency, COVID in January 23, allergies.  No history of GERD, smoking, other tobacco use.  LMP: 1/14.  She has an IUD and denies the possibility of being pregnant.  [facility].  She has been evaluated by heme-onc in October 22 for similar symptoms/cervical lymphadenopathy that were found on MRI, which were ultimately thought to be reactive after a thorough work-up that included TB testing.. There  was no evidence of malignancy.  She also has been seen by her  PCP on 10/02/2021 in the ED on 2/11 or today's symptoms.   Vitamin D, CBC TSH sent, and the patient is to follow-up on 3/9.  Vitamin D level was low, and patient was started on vitamin D supplementation.  CBC, TSH were normal.  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  Past Medical History:  Diagnosis Date   Allergy    Asthma    Patient reported   Constipation     Herpes simplex virus (HSV) infection    Patient reported   Migraine    Ovarian cyst 2012   per patient report    Past Surgical History:  Procedure Laterality Date   Denies surgical hx     NO PAST SURGERIES      Family History  Problem Relation Age of Onset   Ovarian cancer Paternal Grandmother    Breast cancer Paternal Grandmother 45   Cancer Paternal Grandmother        ovarian cancer   Hyperlipidemia Maternal Grandmother    Hypertension Maternal Grandmother    Varicose Veins Maternal Grandmother    Cancer Maternal Grandfather        kidney cancer   Other Father        murdered   Scoliosis Father    Hypertension Mother    Asthma Mother    Stroke Mother    Migraines Mother    Migraines Sister     Social History   Tobacco Use   Smoking status: Never    Passive exposure: Current   Smokeless tobacco: Never  Vaping Use   Vaping Use: Never used  Substance Use Topics   Alcohol use: Yes    Comment: Last ETOH use 07/31/2021 - 1 beer (first ETOH use in one month).   Drug use: Never    No current facility-administered medications for this encounter.  Current Outpatient Medications:    acyclovir (ZOVIRAX) 800 MG tablet, Take 1 tablet (  800 mg total) by mouth daily., Disp: 30 tablet, Rfl: 12   albuterol (VENTOLIN HFA) 108 (90 Base) MCG/ACT inhaler, Inhale 2 puffs into the lungs every 6 (six) hours as needed for wheezing or shortness of breath., Disp: 8 g, Rfl: 2   aspirin-acetaminophen-caffeine (EXCEDRIN MIGRAINE) 250-250-65 MG tablet, Take 1 tablet by mouth daily as needed for headache or migraine. Usually during menses, Disp: , Rfl:    loratadine (CLARITIN) 10 MG tablet, Take 10 mg by mouth daily., Disp: , Rfl:    meloxicam (MOBIC) 15 MG tablet, meloxicam 15 mg tablet  Take 1 tablet every day by oral route as needed., Disp: , Rfl:    PARAGARD INTRAUTERINE COPPER IU, by Intrauterine route. Placed ~2018 per pt., Disp: , Rfl:    SUMAtriptan (IMITREX) 50 MG tablet, TAKE ONE  TABLET BY MOUTH ONCE FOR 1 DOSE, MAY REPEAT IN 2 HOURS IF HEADACHE PERSISTS OR RECURS (Patient not taking: Reported on 10/02/2021), Disp: 9 tablet, Rfl: 0   tiZANidine (ZANAFLEX) 4 MG tablet, tizanidine 4 mg tablet  Take 1 tablet by mouth once daily, Disp: , Rfl:    Vitamin D, Ergocalciferol, (DRISDOL) 1.25 MG (50000 UNIT) CAPS capsule, Take 1 capsule (50,000 Units total) by mouth every 7 (seven) days., Disp: 12 capsule, Rfl: 0  Allergies  Allergen Reactions   Shellfish Allergy Shortness Of Breath, Swelling, Rash and Other (See Comments)    erythema erythema   Penicillin G Itching   Penicillins Itching and Rash   Bee Pollen     Other reaction(s): Other (See Comments) Sneezing   Latex Rash   Pollen Extract Other (See Comments)    Sneezing     ROS  As noted in HPI.   Physical Exam  BP 98/66 (BP Location: Left Arm)    Pulse 90    Temp 99.8 F (37.7 C) (Oral)    Resp 16    LMP 09/06/2021    SpO2 96%   Constitutional: Well developed, well nourished, no acute distress Eyes: PERRL, EOMI, conjunctiva normal bilaterally HENT: Normocephalic, atraumatic,mucus membranes moist.  Positive nasal congestion.  No maxillary, frontal sinus tenderness.  Normal tonsils, oropharynx.  Uvula midline.  No cobblestoning, postnasal drip. Neck: Positive single right-sided cervical lymph node. Respiratory: Clear to auscultation bilaterally, no rales, no wheezing, no rhonchi.  No anterior, lateral chest wall tenderness Cardiovascular: Normal rate and rhythm, no murmurs, no gallops, no rubs GI: Soft, nondistended, normal bowel sounds, nontender, no rebound, no guarding.  No splenomegaly. Lymph: No supraclavicular, inguinal lymphadenopathy skin: No rash, skin intact Musculoskeletal: no deformities Neurologic: Alert & oriented x 3, CN III-XII grossly intact, no motor deficits, sensation grossly intact Psychiatric: Speech and behavior appropriate   ED Course   Medications - No data to display  Orders  Placed This Encounter  Procedures   HIV Antibody (routine testing w rflx)    Standing Status:   Standing    Number of Occurrences:   1   No results found for this or any previous visit (from the past 24 hour(s)). No results found.  ED Clinical Impression  1. Other fatigue   2. Enlarged lymph node      ED Assessment/Plan  Previous records, labs extensively reviewed, as noted in HPI.  Patient's symptoms could be left over from her recent COVID infection, but it seems that she has been evaluated over the past several months for similar symptoms.  However will check HIV because she has a new sexual partner since her most recent  HIV test in December 2022. Suspect that the COVID has aggravated her asthma, so sending home with regularly scheduled albuterol inhaler with a spacer, 6 days of prednisone.  Dysphagia with solids, dry cough may also be due to to acid reflux with this, starting her on Pepcid.  She will need to follow-up with her primary care provider if this does not work for reevaluation and possible referral to GI or pulmonary.  Unsure as to the etiology of her lymphadenopathy, but heme-onc evaluation was negative for any malignancy, will have her follow-up with her primary care provider for this.  Discussed labs, MDM, treatment plan, and plan for follow-up with patient.  patient agrees with plan.   No orders of the defined types were placed in this encounter.     *This clinic note was created using Dragon dictation software. Therefore, there may be occasional mistakes despite careful proofreading. ?    Domenick Gong, MD 10/22/21 [phone]

## 2021-10-21 NOTE — Discharge Instructions (Addendum)
We will contact you if your HIV comes back positive.  In the meantime, 2 puffs from your albuterol inhaler using your spacer every 4 hours for 2 days, then every 6 hours for 2 days, then as needed.  You may back off on the albuterol if you start to feel better sooner.  Try the Pepcid as well.

## 2021-10-21 NOTE — ED Triage Notes (Signed)
Pt presents with cough, SOB, chills, and neck pain x 2 months. Pt was dx with covid in January.

## 2021-10-23 LAB — HIV ANTIBODY (ROUTINE TESTING W REFLEX): HIV Screen 4th Generation wRfx: NONREACTIVE

## 2021-10-29 ENCOUNTER — Ambulatory Visit: Payer: Self-pay | Admitting: Licensed Clinical Social Worker

## 2021-11-04 ENCOUNTER — Ambulatory Visit: Payer: Self-pay | Admitting: Gerontology

## 2021-11-07 ENCOUNTER — Ambulatory Visit: Payer: Self-pay

## 2021-11-08 ENCOUNTER — Ambulatory Visit
Admission: RE | Admit: 2021-11-08 | Discharge: 2021-11-08 | Disposition: A | Discharge status: Home / Self Care | Payer: Self-pay | Source: Ambulatory Visit | Attending: Family Medicine | Admitting: Family Medicine

## 2021-11-08 ENCOUNTER — Other Ambulatory Visit: Payer: Self-pay

## 2021-11-08 VITALS — BP 121/77 | HR 75 | Temp 98.9°F | Resp 16

## 2021-11-08 DIAGNOSIS — R5383 Other fatigue: Secondary | ICD-10-CM

## 2021-11-08 DIAGNOSIS — R21 Rash and other nonspecific skin eruption: Secondary | ICD-10-CM

## 2021-11-08 DIAGNOSIS — R1310 Dysphagia, unspecified: Secondary | ICD-10-CM

## 2021-11-08 MED ORDER — TRIAMCINOLONE ACETONIDE 0.025 % EX OINT: 1.0000 "application " | TOPICAL_OINTMENT | Freq: Two times a day (BID) | CUTANEOUS | 0 refills | Status: DC

## 2021-11-08 NOTE — Discharge Instructions (Addendum)
Request a referral from ENT (ear, nose, and throat) specialist to further evaluate your throat. ?I have prescribed you triamcinolone cream to apply to the rash.  It does not appear to be an allergic reaction however could be a hypersensitivity of the skin of unknown cause. ?Keep follow-up with primary care doctor continue to take vitamin D weekly as stated it may take up to 12 weeks for you to feel the significant effects of the vitamin D replacement. ?

## 2021-11-08 NOTE — ED Provider Notes (Signed)
?[facility] ? ? ? ?CSN: [id number] ?Arrival date & time: 11/08/21  [id number] ? ? ?  ? ?History   ?Chief Complaint ?Chief Complaint  ?Patient presents with  ? Fatigue  ? Rash  ? Inflammation  ? ? ?HPI ?Brittany Cardenas is a 31 y.o. female.  ? ?HPI ?Patient with a history of adjustment disorder with mixed mood of anxiety and depression, presents today for evaluation of rash, fatigue and inflammation.  Patient has been seen by multiple healthcare providers within the last 2 months for varying issues however has been seen for fatigue at least 4 different times.  She was also evaluated for an enlarged lymph node here at urgent care on 10/21/2021 and was subsequently treated for an asthma exacerbation and tested for HIV.  She has had several unremarkable labs including a TSH although her PCP diagnosed her with vitamin D deficiency and she was started on vitamin D supplements on 10/07/2021 which she is taking 50,000 units of vitamin D weekly.  She endorses that she still severely fatigue.  She reports inflammation in her throat and in her hands and endorses swelling of her hands.  Reports this is been worsening.  She also complains of a rash on her flank and periodic spots on her back which she was concerned was related to her recent course of steroids.  Patient continues to complain that no one is listening to her as she has been to multiple doctors and no one is given her direct answer as to the cause of her symptoms.  Discussed at length them prior negative testing and patient insist that there is something wrong with her and no one is listening. Patient has a follow-up with her primary care doctor for evaluation of fatigue and inflammation.  She is also concerned about her thyroid gland and reports that she has had a neck scan in the past which showed a nodule.  She is wanting to see an endocrinologist however her primary care has been monitoring her TSH levels which have all been unremarkable. ?Patient  suffers from anxiety and depression however is not currently treated on any antidepressant medication but is followed by behavioral health counselor.  Patient became very disgruntled when the question was posed that possibly her symptoms could be related to a panic attack.  She erected and anger and stated that this Probation officer the provider was being rude.  Restated to patient that all of her levels in all of the provider she seen recently have been unable to determine the etiology of her symptoms with the exception of a low vitamin D level.  She reports that the vitamin D replacement has not been improving her symptoms of fatigue.  Explained again that she had not been on vitamin D replacement for the length of time to really feel the effects of the replacement. ?Patient continues to complain of difficulty swallowing and difficulty breathing however during encounter patient is not in any form of distress with talking, coughing or difficulty swallowing.  She has not been evaluated by an ENT.  She completed the asthma treatment that was prescribed during her visit February 28.  She is concerned that the prednisone may have caused her develop a rash.  She reports isolated areas of itching on her bilateral flank areas, lower back and upper back.  She has not had any swelling of her lips or rash on her face. She completed the course of prednisone and is no longer having asthma related symptoms ? ? ?  Past Medical History:  ?Diagnosis Date  ? Allergy   ? Asthma   ? Patient reported  ? Constipation   ? Herpes simplex virus (HSV) infection   ? Patient reported  ? Migraine   ? Ovarian cyst 2012  ? per patient report  ? ? ?Patient Active Problem List  ? Diagnosis Date Noted  ? Easy bruising 06/18/2021  ? Epistaxis 06/18/2021  ? Other fatigue 06/18/2021  ? Enlarged lymph node 06/04/2021  ? Vaginal discharge 06/04/2021  ? Feeling worried 03/19/2021  ? Tonsillitis with exudate 02/26/2021  ? Elevated heart rate with elevated blood  pressure without diagnosis of hypertension 10/22/2020  ? History of asthma 09/25/2020  ? History of migraine 09/25/2020  ? History of constipation 09/25/2020  ? Encounter to establish care 09/25/2020  ? HSV infection 05/24/2020  ? Cervical dysplasia 10/11/2015  ? ? ?Past Surgical History:  ?Procedure Laterality Date  ? Denies surgical hx    ? NO PAST SURGERIES    ? ? ?OB History   ? ? Gravida  ?2  ? Para  ?1  ? Term  ?1  ? Preterm  ?0  ? AB  ?1  ? Living  ?1  ?  ? ? SAB  ?1  ? IAB  ?   ? Ectopic  ?   ? Multiple  ?   ? Live Births  ?1  ?   ?  ?  ? ? ? ?Home Medications   ? ?Prior to Admission medications   ?Medication Sig Start Date End Date Taking? Authorizing Provider  ?acyclovir (ZOVIRAX) 800 MG tablet Take 1 tablet (800 mg total) by mouth daily. 01/16/21   Jerene Dilling, PA  ?albuterol (VENTOLIN HFA) 108 (90 Base) MCG/ACT inhaler Inhale 1-2 puffs into the lungs every 6 (six) hours as needed for wheezing or shortness of breath. 10/21/21   Melynda Ripple, MD  ?aspirin-acetaminophen-caffeine [facility] MIGRAINE) [phone] MG tablet Take 1 tablet by mouth daily as needed for headache or migraine. Usually during menses    [provider]  ?famotidine (PEPCID) 20 MG tablet Take 1 tablet (20 mg total) by mouth 2 (two) times daily. 10/21/21   Melynda Ripple, MD  ?loratadine (CLARITIN) 10 MG tablet Take 10 mg by mouth daily.    [provider]  ?PARAGARD INTRAUTERINE COPPER IU by Intrauterine route. Placed ~2018 per pt.    [provider]  ?predniSONE (STERAPRED UNI-PAK 21 TAB) 10 MG (21) TBPK tablet Dispense one 6 day pack. Take as directed with food. 10/21/21   Melynda Ripple, MD  ?Spacer/Aero-Holding Chambers (AEROCHAMBER PLUS) inhaler Use as instructed 10/21/21   Melynda Ripple, MD  ?SUMAtriptan (IMITREX) 50 MG tablet TAKE ONE TABLET BY MOUTH ONCE FOR 1 DOSE, MAY REPEAT IN 2 HOURS IF HEADACHE PERSISTS OR RECURS ?Patient not taking: Reported on 10/02/2021 09/25/20 09/25/21  Iloabachie,  Chioma E, NP  ?tiZANidine (ZANAFLEX) 4 MG tablet tizanidine 4 mg tablet ? Take 1 tablet by mouth once daily    [provider]  ?Vitamin D, Ergocalciferol, (DRISDOL) 1.25 MG (50000 UNIT) CAPS capsule Take 1 capsule (50,000 Units total) by mouth every 7 (seven) days. 10/07/21   Iloabachie, Chioma E, NP  ? ? ?Family History ?Family History  ?Problem Relation Age of Onset  ? Ovarian cancer Paternal Grandmother   ? Breast cancer Paternal Grandmother 11  ? Cancer Paternal Grandmother   ?     ovarian cancer  ? Hyperlipidemia Maternal Grandmother   ? Hypertension Maternal Grandmother   ?  Varicose Veins Maternal Grandmother   ? Cancer Maternal Grandfather   ?     kidney cancer  ? Other Father   ?     murdered  ? Scoliosis Father   ? Hypertension Mother   ? Asthma Mother   ? Stroke Mother   ? Migraines Mother   ? Migraines Sister   ? ? ?Social History ?Social History  ? ?Tobacco Use  ? Smoking status: Never  ?  Passive exposure: Current  ? Smokeless tobacco: Never  ?Vaping Use  ? Vaping Use: Never used  ?Substance Use Topics  ? Alcohol use: Yes  ?  Comment: Last ETOH use 07/31/2021 - 1 beer (first ETOH use in one month).  ? Drug use: Never  ? ? ? ?Allergies   ?Shellfish allergy, Penicillin g, Penicillins, Bee pollen, Latex, and Pollen extract ? ?Review of Systems ?Review of Systems ?Pertinent negatives listed in HPI  ?Physical Exam ?Triage Vital Signs ?ED Triage Vitals  ?Enc Vitals Group  ?   BP   ?   Pulse   ?   Resp   ?   Temp   ?   Temp src   ?   SpO2   ?   Weight   ?   Height   ?   Head Circumference   ?   Peak Flow   ?   Pain Score   ?   Pain Loc   ?   Pain Edu?   ?   Excl. in [place]?   ? ?No data found. ? ?Updated Vital Signs ?BP 121/77 (BP Location: Left Arm)   Pulse 75   Temp 98.9 ?F (37.2 ?C) (Oral)   Resp 16   SpO2 97%  ? ?Visual Acuity ?Right Eye Distance:   ?Left Eye Distance:   ?Bilateral Distance:   ? ?Right Eye Near:   ?Left Eye Near:    ?Bilateral Near:    ? ?Physical Exam ?Constitutional:   ?    Appearance: Normal appearance.  ?HENT:  ?   Head: Normocephalic and atraumatic.  ?   Mouth/Throat:  ?   Mouth: Mucous membranes are moist.  ?   Pharynx: No oropharyngeal exudate or posterior oropharyngeal erythema.  ?N

## 2021-11-08 NOTE — ED Triage Notes (Signed)
Pt c/o inflammation on her hands, face and neck for several weeks. She also has a rash on her legs.  ?

## 2021-11-13 ENCOUNTER — Other Ambulatory Visit: Payer: Self-pay

## 2021-11-13 ENCOUNTER — Ambulatory Visit: Payer: Self-pay | Admitting: Gerontology

## 2021-11-13 ENCOUNTER — Encounter: Payer: Self-pay | Admitting: Gerontology

## 2021-11-13 VITALS — BP 112/75 | HR 97 | Temp 97.8°F | Resp 16 | Ht 64.0 in | Wt 157.1 lb

## 2021-11-13 DIAGNOSIS — R0989 Other specified symptoms and signs involving the circulatory and respiratory systems: Secondary | ICD-10-CM | POA: Insufficient documentation

## 2021-11-13 DIAGNOSIS — R0602 Shortness of breath: Secondary | ICD-10-CM | POA: Insufficient documentation

## 2021-11-13 NOTE — Progress Notes (Signed)
? ?Established Patient Office Visit ? ?Subjective:  ?Patient ID: Brittany Cardenas, female    DOB: 09-Aug-1991  Age: 31 y.o. MRN: [id number] ? ?CC: No chief complaint on file. ? ? ?HPI ?Brittany Cardenas  is a 31 y/o female who has history of  Allergy, Asthma,Constipation, Herpes Simplex virus, Migraine,presents for c/o of feeling of choking sensation. She reports experiencing choking sensation when she's eating and it feels like her throat is closing, but she denies cough while eating. She also c/o shortness of breath with exertion especially when exercising, states that she feels that she can not catch her breath especially while sleeping, uses 2 or more pillows.  She was seen at the ED on 10/21/21  for Enlarged  lymph node , currently, she denies lymphadenopathy. She was also seen at the Urgent care  on 11/08/21 for rash, and fatigue. She states that the rash to her right thigh  and flank has resolved 90% after using otc steriod cream. She also requested a note to go back to work. Overall, she states that she's doing well and offers no further concern. ? ?Past Medical History:  ?Diagnosis Date  ? Allergy   ? Asthma   ? Patient reported  ? Constipation   ? Herpes simplex virus (HSV) infection   ? Patient reported  ? Migraine   ? Ovarian cyst 2012  ? per patient report  ? ? ?Past Surgical History:  ?Procedure Laterality Date  ? Denies surgical hx    ? NO PAST SURGERIES    ? ? ?Family History  ?Problem Relation Age of Onset  ? Ovarian cancer Paternal Grandmother   ? Breast cancer Paternal Grandmother 30  ? Cancer Paternal Grandmother   ?     ovarian cancer  ? Hyperlipidemia Maternal Grandmother   ? Hypertension Maternal Grandmother   ? Varicose Veins Maternal Grandmother   ? Cancer Maternal Grandfather   ?     kidney cancer  ? Other Father   ?     murdered  ? Scoliosis Father   ? Hypertension Mother   ? Asthma Mother   ? Stroke Mother   ? Migraines Mother   ? Migraines Sister   ? ? ?Social History   ? ?Socioeconomic History  ? Marital status: Divorced  ?  Spouse name: NA  ? Number of children: 1  ? Years of education: 3016  ? Highest education level: Bachelor's degree (e.g., BA, AB, BS)  ?Occupational History  ? Occupation: Employed  ? Occupation: Midwifetextile factory employee  ?Tobacco Use  ? Smoking status: Never  ?  Passive exposure: Current  ? Smokeless tobacco: Never  ?Vaping Use  ? Vaping Use: Never used  ?Substance and Sexual Activity  ? Alcohol use: Not Currently  ?  Comment: Last ETOH use 07/31/2021 - 1 beer (first ETOH use in one month).  ? Drug use: Never  ? Sexual activity: Not Currently  ?  Partners: Male  ?  Birth control/protection: I.U.D.  ?  Comment: Paragard  ?Other Topics Concern  ? Not on file  ?Social History Narrative  ? Patient is a single mom, who lives with her mom, sister and niece. She is employed, and has a good support system with family and friends.    ? ?Social Determinants of Health  ? ?Financial Resource Strain: Not on file  ?Food Insecurity: No Food Insecurity  ? Worried About Programme researcher, broadcasting/film/videounning Out of Food in the Last Year: Never true  ? Ran  Out of Food in the Last Year: Never true  ?Transportation Needs: No Transportation Needs  ? Lack of Transportation (Medical): No  ? Lack of Transportation (Non-Medical): No  ?Physical Activity: Not on file  ?Stress: Not on file  ?Social Connections: Not on file  ?Intimate Partner Violence: Not At Risk  ? Fear of Current or Ex-Partner: No  ? Emotionally Abused: No  ? Physically Abused: No  ? Sexually Abused: No  ? ? ?Outpatient Medications Prior to Visit  ?Medication Sig Dispense Refill  ? acyclovir (ZOVIRAX) 800 MG tablet Take 1 tablet (800 mg total) by mouth daily. 30 tablet 12  ? albuterol (VENTOLIN HFA) 108 (90 Base) MCG/ACT inhaler Inhale 1-2 puffs into the lungs every 6 (six) hours as needed for wheezing or shortness of breath. 1 each 0  ? aspirin-acetaminophen-caffeine (EXCEDRIN MIGRAINE) 250-250-65 MG tablet Take 1 tablet by mouth daily as needed for  headache or migraine. Usually during menses    ? loratadine (CLARITIN) 10 MG tablet Take 10 mg by mouth daily.    ? PARAGARD INTRAUTERINE COPPER IU by Intrauterine route. Placed ~2018 per pt.    ? Spacer/Aero-Holding Chambers (AEROCHAMBER PLUS) inhaler Use as instructed 1 each 2  ? tiZANidine (ZANAFLEX) 4 MG tablet tizanidine 4 mg tablet ? Take 1 tablet by mouth once daily    ? Vitamin D, Ergocalciferol, (DRISDOL) 1.25 MG (50000 UNIT) CAPS capsule Take 1 capsule (50,000 Units total) by mouth every 7 (seven) days. 12 capsule 0  ? famotidine (PEPCID) 20 MG tablet Take 1 tablet (20 mg total) by mouth 2 (two) times daily. (Patient not taking: Reported on 11/13/2021) 40 tablet 0  ? predniSONE (STERAPRED UNI-PAK 21 TAB) 10 MG (21) TBPK tablet Dispense one 6 day pack. Take as directed with food. 21 tablet 0  ? SUMAtriptan (IMITREX) 50 MG tablet TAKE ONE TABLET BY MOUTH ONCE FOR 1 DOSE, MAY REPEAT IN 2 HOURS IF HEADACHE PERSISTS OR RECURS (Patient not taking: Reported on 10/02/2021) 9 tablet 0  ? triamcinolone (KENALOG) 0.025 % ointment Apply 1 application. topically 2 (two) times daily. (Patient not taking: Reported on 11/13/2021) 60 g 0  ? ?No facility-administered medications prior to visit.  ? ? ?Allergies  ?Allergen Reactions  ? Shellfish Allergy Shortness Of Breath, Swelling, Rash and Other (See Comments)  ?  erythema ?erythema  ? Penicillin G Itching  ? Penicillins Itching and Rash  ? Bee Pollen   ?  Other reaction(s): Other (See Comments) ?Sneezing  ? Latex Rash  ? Pollen Extract Other (See Comments)  ?  Sneezing  ? ? ?ROS ?Review of Systems  ?Constitutional: Negative.  Negative for fatigue.  ?HENT:    ?     Choking sensation  ?Respiratory:  Positive for choking (while breathing) and shortness of breath. Negative for stridor.   ?Cardiovascular: Negative.   ?Musculoskeletal: Negative.   ?Neurological: Negative.   ?Psychiatric/Behavioral: Negative.    ? ?  ?Objective:  ?  ?Physical Exam ?HENT:  ?   Head: Normocephalic and  atraumatic.  ?   Mouth/Throat:  ?   Mouth: Mucous membranes are moist.  ?   Pharynx: Oropharynx is clear. Uvula midline.  ?   Tonsils: No tonsillar exudate or tonsillar abscesses.  ?Cardiovascular:  ?   Rate and Rhythm: Normal rate and regular rhythm.  ?   Pulses: Normal pulses.  ?   Heart sounds: Normal heart sounds.  ?Pulmonary:  ?   Effort: Pulmonary effort is normal.  ?  Breath sounds: Normal breath sounds.  ?Musculoskeletal:  ?   Cervical back: Normal range of motion.  ?Skin: ?   General: Skin is warm.  ?Neurological:  ?   General: No focal deficit present.  ?   Mental Status: She is alert and oriented to person, place, and time. Mental status is at baseline.  ?Psychiatric:     ?   Mood and Affect: Mood normal.     ?   Behavior: Behavior normal.     ?   Thought Content: Thought content normal.     ?   Judgment: Judgment normal.  ? ? ?BP 112/75 (BP Location: Right Arm, Patient Position: Sitting, Cuff Size: Large)   Pulse 97   Temp 97.8 ?F (36.6 ?C) (Oral)   Resp 16   Ht 5\' 4"  (1.626 m)   Wt 157 lb 1.6 oz (71.3 kg)   LMP 10/27/2021 (Exact Date) Comment: IUD  SpO2 98%   BMI 26.97 kg/m?  ?Wt Readings from Last 3 Encounters:  ?11/13/21 157 lb 1.6 oz (71.3 kg)  ?10/02/21 146 lb 3.2 oz (66.3 kg)  ?08/28/21 148 lb (67.1 kg)  ? ? ? ?Health Maintenance Due  ?Topic Date Due  ? Hepatitis C Screening  Never done  ? COVID-19 Vaccine (3 - Pfizer risk series) 01/30/2020  ? INFLUENZA VACCINE  Never done  ? ? ?There are no preventive care reminders to display for this patient. ? ?Lab Results  ?Component Value Date  ? TSH 1.530 10/02/2021  ? ?Lab Results  ?Component Value Date  ? WBC 6.9 10/02/2021  ? HGB 13.2 10/02/2021  ? HCT 39.1 10/02/2021  ? MCV 91 10/02/2021  ? PLT 275 10/02/2021  ? ?Lab Results  ?Component Value Date  ? NA 137 06/18/2021  ? K 3.8 06/18/2021  ? CO2 27 06/18/2021  ? GLUCOSE 91 06/18/2021  ? BUN 13 06/18/2021  ? CREATININE 0.61 06/18/2021  ? BILITOT 0.5 06/18/2021  ? ALKPHOS 59 06/18/2021  ? AST 26  06/18/2021  ? ALT 22 06/18/2021  ? PROT 8.0 06/18/2021  ? ALBUMIN 4.4 06/18/2021  ? CALCIUM 9.3 06/18/2021  ? ANIONGAP 7 06/18/2021  ? ?Lab Results  ?Component Value Date  ? CHOL 128 09/25/2020  ? ?Lab Results

## 2022-01-14 ENCOUNTER — Other Ambulatory Visit: Payer: Self-pay | Admitting: Gerontology

## 2022-01-14 ENCOUNTER — Encounter: Payer: Self-pay | Admitting: Family Medicine

## 2022-01-14 ENCOUNTER — Other Ambulatory Visit (HOSPITAL_COMMUNITY): Payer: Self-pay

## 2022-01-14 DIAGNOSIS — Z8669 Personal history of other diseases of the nervous system and sense organs: Secondary | ICD-10-CM

## 2022-01-14 MED ORDER — SUMATRIPTAN SUCCINATE 50 MG PO TABS
ORAL_TABLET | ORAL | 0 refills | Status: DC
  Filled 2022-01-14: qty 9, 30d supply, fill #0

## 2022-01-15 ENCOUNTER — Other Ambulatory Visit (HOSPITAL_COMMUNITY): Payer: Self-pay

## 2022-01-20 ENCOUNTER — Ambulatory Visit: Payer: Self-pay | Admitting: Family Medicine

## 2022-01-20 ENCOUNTER — Encounter: Payer: Self-pay | Admitting: Gerontology

## 2022-01-20 ENCOUNTER — Ambulatory Visit: Payer: Self-pay | Admitting: Gerontology

## 2022-01-20 ENCOUNTER — Other Ambulatory Visit: Payer: Self-pay

## 2022-01-20 VITALS — BP 117/82 | HR 91 | Temp 99.0°F | Resp 16 | Ht 64.0 in | Wt 158.2 lb

## 2022-01-20 VITALS — BP 129/84 | Ht 64.0 in | Wt 158.6 lb

## 2022-01-20 DIAGNOSIS — Z8669 Personal history of other diseases of the nervous system and sense organs: Secondary | ICD-10-CM

## 2022-01-20 DIAGNOSIS — R0602 Shortness of breath: Secondary | ICD-10-CM

## 2022-01-20 DIAGNOSIS — B009 Herpesviral infection, unspecified: Secondary | ICD-10-CM

## 2022-01-20 DIAGNOSIS — R059 Cough, unspecified: Secondary | ICD-10-CM | POA: Insufficient documentation

## 2022-01-20 DIAGNOSIS — J029 Acute pharyngitis, unspecified: Secondary | ICD-10-CM | POA: Insufficient documentation

## 2022-01-20 DIAGNOSIS — N907 Vulvar cyst: Secondary | ICD-10-CM

## 2022-01-20 DIAGNOSIS — Z113 Encounter for screening for infections with a predominantly sexual mode of transmission: Secondary | ICD-10-CM

## 2022-01-20 DIAGNOSIS — R051 Acute cough: Secondary | ICD-10-CM

## 2022-01-20 LAB — WET PREP FOR TRICH, YEAST, CLUE
Trichomonas Exam: NEGATIVE
Yeast Exam: NEGATIVE

## 2022-01-20 MED ORDER — SORE THROAT LOZENGES 6-10 MG MT LOZG
1.0000 | LOZENGE | OROMUCOSAL | 0 refills | Status: DC | PRN
  Filled 2022-01-20: qty 100, fill #0

## 2022-01-20 MED ORDER — ACYCLOVIR 800 MG PO TABS: 800.0000 mg | ORAL_TABLET | Freq: Every day | ORAL | 11 refills | Status: AC

## 2022-01-20 MED ORDER — SUMATRIPTAN SUCCINATE 50 MG PO TABS
ORAL_TABLET | ORAL | 0 refills | Status: AC
  Filled 2022-01-20: qty 9, 30d supply, fill #0
  Filled 2022-01-20: qty 9, fill #0

## 2022-01-20 MED ORDER — BENZONATATE 100 MG PO CAPS
100.0000 mg | ORAL_CAPSULE | Freq: Three times a day (TID) | ORAL | 0 refills | Status: DC | PRN
  Filled 2022-01-20 (×2): qty 20, 7d supply, fill #0

## 2022-01-20 MED ORDER — ALBUTEROL SULFATE HFA 108 (90 BASE) MCG/ACT IN AERS
1.0000 | INHALATION_SPRAY | Freq: Four times a day (QID) | RESPIRATORY_TRACT | 0 refills | Status: AC | PRN
  Filled 2022-01-20 (×2): qty 6.7, 30d supply, fill #0

## 2022-01-20 NOTE — Patient Instructions (Signed)
Sore Throat A sore throat is pain, burning, irritation, or scratchiness in the throat. When you have a sore throat, you may feel pain or tenderness in your throat when you swallow or talk. Many things can cause a sore throat, including: An infection. Seasonal allergies. Dryness in the air. Irritants, such as smoke or pollution. Radiation treatment for cancer. Gastroesophageal reflux disease (GERD). A tumor. A sore throat is often the first sign of another sickness. It may happen with other symptoms, such as coughing, sneezing, fever, and swollen neck glands. Most sore throats go away without medical treatment. Follow these instructions at home:     Medicines Take over-the-counter and prescription medicines only as told by your health care provider. Children often get sore throats. Do not give your child aspirin because of the association with Reye's syndrome. Use throat sprays to soothe your throat as told by your health care provider. Managing pain To help with pain, try: Sipping warm liquids, such as broth, herbal tea, or warm water. Eating or drinking cold or frozen liquids, such as frozen ice pops. Gargling with a mixture of salt and water 3-4 times a day or as needed. To make salt water, completely dissolve -1 tsp (3-6 g) of salt in 1 cup (237 mL) of warm water. Sucking on hard candy or throat lozenges. Putting a cool-mist humidifier in your bedroom at night to moisten the air. Sitting in the bathroom with the door closed for 5-10 minutes while you run hot water in the shower. General instructions Do not use any products that contain nicotine or tobacco. These products include cigarettes, chewing tobacco, and vaping devices, such as e-cigarettes. If you need help quitting, ask your health care provider. Rest as needed. Drink enough fluid to keep your urine pale yellow. Wash your hands often with soap and water for at least 20 seconds. If soap and water are not available, use hand  sanitizer. Contact a health care provider if: You have a fever for more than 2-3 days. You have symptoms that last for more than 2-3 days. Your throat does not get better within 7 days. You have a fever and your symptoms suddenly get worse. Get help right away if: You have difficulty breathing. You cannot swallow fluids, soft foods, or your saliva. You have increased swelling in your throat or neck. You have persistent nausea and vomiting. These symptoms may represent a serious problem that is an emergency. Do not wait to see if the symptoms will go away. Get medical help right away. Call your local emergency services (911 in the [place] Do not drive yourself to the hospital. Summary A sore throat is pain, burning, irritation, or scratchiness in the throat. Many things can cause a sore throat. Take over-the-counter medicines only as told by your health care provider. Rest as needed. Drink enough fluid to keep your urine pale yellow. Contact a health care provider if your throat does not get better within 7 days. This information is not intended to replace advice given to you by your health care provider. Make sure you discuss any questions you have with your health care provider. Document Revised: 11/06/2020 Document Reviewed: 11/06/2020 Elsevier Patient Education  2023 Elsevier Inc.  

## 2022-01-20 NOTE — Progress Notes (Signed)
Pt here for lump on labia. WET PREP negative and no treatment indicated. Pt verbalized understanding of discharge instructions for care of labial swelling, and further results pending.  Lethea Killings RN

## 2022-01-20 NOTE — Progress Notes (Signed)
S:  31 yo here today with c/o L labial pain and swelling x 24 hours.  Client states that she has had cysts before on the R labia usually no pain just swelling. 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  She states that this lesion doesn't look or feel like her HSV2 outbreak.  States she has a new partner x 2 months, has been sexually active x 2 month.  O: L labia majora-  .5cm erythematous, tender fluctuate mass. Lesion opened with syringe needle and small amount of purulent/blood discharge expressed. Wetprep and GC/CT collected vaginally.  A: L labial cyst      HSV2- needs RX  P: Co to apply OTC triple antibiotic oint to labia TID, warm compress to labia TID and take OTC pain medication as needed.      Notify clinic if symptoms don't resolve.       Acyclovir 800 mg po daily x 1 year.  Disp. #30 x 11 refills.      Wet prep and GC/CT today.  Treat as per SO

## 2022-01-20 NOTE — Progress Notes (Signed)
Established Patient Office Visit  Subjective   Patient ID: Brittany Cardenas, female    DOB: 1990/12/04  Age: 31 y.o. MRN: [id number]  Chief Complaint  Patient presents with   Respiratory Distress    Patient c/o burning in her lungs and difficulty breathing x 5 days. Patient taking OTC cold/flu medicine, allergy medicine and Mucinex without relief. Patient has not taken a COVID test.   Diarrhea    Patient states diarrhea started 5 days ago, but has resolved.   Fever    Patient states temp 101 this morning and has had fever x 4 days.   Cough    Patient c/o dry cough x 4 days    HPI Brittany Cardenas  is a 31 y/o female who has history of  Allergy, Asthma,Constipation, Herpes Simplex virus, Migraine present for c/o non productive cough, sore throat, fever, nausea , migraine and shortness of breath that has been going on for 5 days. She reports having sick contact and has not been tested for Covid.  She reports taking otc Mucinex without relief. She denies chest pain, rhinorrhea, sinus pressure, chills and post nasal drip. Overall, she states that she's concerned about her symptoms.     Review of Systems  Constitutional:  Positive for fever.  HENT:  Positive for sore throat. Negative for sinus pain.   Respiratory:  Positive for cough and shortness of breath.   Cardiovascular: Negative.   Neurological:  Positive for headaches (migraine headache).     Objective:     BP 117/82 (BP Location: Left Arm, Patient Position: Sitting, Cuff Size: Large)   Pulse 91   Temp 99 F (37.2 C) (Oral)   Resp 16   Ht 5\' 4"  (1.626 m)   Wt 158 lb 3.2 oz (71.8 kg)   LMP 12/14/2021 (Exact Date) Comment: IUD  SpO2 94%   BMI 27.15 kg/m  BP Readings from Last 3 Encounters:  01/20/22 117/82  01/20/22 129/84  11/13/21 112/75   Wt Readings from Last 3 Encounters:  01/20/22 158 lb 3.2 oz (71.8 kg)  01/20/22 158 lb 9.6 oz (71.9 kg)  11/13/21 157 lb 1.6 oz (71.3 kg)       Physical Exam HENT:     Head: Normocephalic and atraumatic.     Mouth/Throat:     Mouth: Mucous membranes are moist.     Pharynx: Oropharynx is clear. Uvula midline.     Tonsils: No tonsillar exudate or tonsillar abscesses.  Cardiovascular:     Rate and Rhythm: Normal rate and regular rhythm.     Pulses: Normal pulses.     Heart sounds: Normal heart sounds.  Pulmonary:     Effort: Pulmonary effort is normal.     Breath sounds: Normal breath sounds.  Neurological:     General: No focal deficit present.     Mental Status: She is alert and oriented to person, place, and time. Mental status is at baseline.     Results for orders placed or performed in visit on 01/20/22  WET PREP FOR TRICH, YEAST, CLUE  Result Value Ref Range   Trichomonas Exam Negative Negative   Yeast Exam Negative Negative   Clue Cell Exam Comment: Negative    Last CBC Lab Results  Component Value Date   WBC 6.9 10/02/2021   HGB 13.2 10/02/2021   HCT 39.1 10/02/2021   MCV 91 10/02/2021   MCH 30.6 10/02/2021   RDW 12.3 10/02/2021   PLT 275 10/02/2021  Last metabolic panel Lab Results  Component Value Date   GLUCOSE 91 06/18/2021   NA 137 06/18/2021   K 3.8 06/18/2021   CL 103 06/18/2021   CO2 27 06/18/2021   BUN 13 06/18/2021   CREATININE 0.61 06/18/2021   GFRNONAA >60 06/18/2021   CALCIUM 9.3 06/18/2021   PROT 8.0 06/18/2021   ALBUMIN 4.4 06/18/2021   LABGLOB 2.9 09/25/2020   AGRATIO 1.4 09/25/2020   BILITOT 0.5 06/18/2021   ALKPHOS 59 06/18/2021   AST 26 06/18/2021   ALT 22 06/18/2021   ANIONGAP 7 06/18/2021   Last lipids Lab Results  Component Value Date   CHOL 128 09/25/2020   HDL 54 09/25/2020   LDLCALC 59 09/25/2020   TRIG 72 09/25/2020   CHOLHDL 2.4 09/25/2020   Last hemoglobin A1c No results found for: HGBA1C    The ASCVD Risk score (Arnett DK, et al., 2019) failed to calculate for the following reasons:   The 2019 ASCVD risk score is only valid for ages 26 to  74    Assessment & Plan:   1. History of migraine - She will continue current medication, advised to get tested for Covid. - SUMAtriptan (IMITREX) 50 MG tablet; TAKE 1 TABLET BY MOUTH ONCE FOR 1 DOSE, MAY REPEAT IN 2 HOURS IF HEADACHE PERSISTS OR RECURS  Dispense: 9 tablet; Refill: 0  2. Acute cough - Her lungs were clear, she was advised to get tested for Covid and was started on Tessalon perles, increase fluid intake. - benzonatate (TESSALON PERLES) 100 MG capsule; Take 1 capsule (100 mg total) by mouth 3 (three) times daily as needed for cough.  Dispense: 20 capsule; Refill: 0  3. Sore throat - She was advised to get tested for Covid , started on Lozenges , increase fluid intake and go to the ED for worsening symptoms. - benzocaine-menthol (SORE THROAT LOZENGES) 6-10 MG lozenge; Take 1 lozenge by mouth as needed for sore throat.  Dispense: 100 tablet; Refill: 0  4. Shortness of breath -She was advised to get tested for Covid , use albuterol as needed and go to the ED for worsening symptoms. - albuterol (VENTOLIN HFA) 108 (90 Base) MCG/ACT inhaler; Inhale 1-2 puffs into the lungs every 6 (six) hours as needed for wheezing or shortness of breath.  Dispense: 1 each; Refill: 0   Return in about 23 days (around 02/12/2022), or if symptoms worsen or fail to improve.    Yerlin Gasparyan Trellis Paganini, NP

## 2022-01-27 ENCOUNTER — Ambulatory Visit: Payer: Self-pay

## 2022-02-04 ENCOUNTER — Ambulatory Visit
Admission: RE | Admit: 2022-02-04 | Discharge: 2022-02-04 | Disposition: A | Discharge status: Home / Self Care | Payer: Self-pay | Source: Ambulatory Visit | Attending: Gerontology | Admitting: Gerontology

## 2022-02-04 ENCOUNTER — Other Ambulatory Visit: Payer: Self-pay | Admitting: Gerontology

## 2022-02-04 ENCOUNTER — Ambulatory Visit
Admission: RE | Admit: 2022-02-04 | Discharge: 2022-02-04 | Disposition: A | Discharge status: Home / Self Care | Payer: Self-pay | Attending: Physician Assistant | Admitting: Physician Assistant

## 2022-02-04 DIAGNOSIS — R051 Acute cough: Secondary | ICD-10-CM

## 2022-02-05 ENCOUNTER — Ambulatory Visit: Payer: Self-pay | Admitting: Gerontology

## 2022-02-05 ENCOUNTER — Encounter: Payer: Self-pay | Admitting: Gerontology

## 2022-02-05 ENCOUNTER — Other Ambulatory Visit: Payer: Self-pay

## 2022-02-05 VITALS — BP 119/86 | HR 90 | Temp 98.3°F | Resp 16 | Ht 64.0 in | Wt 155.7 lb

## 2022-02-05 DIAGNOSIS — R051 Acute cough: Secondary | ICD-10-CM

## 2022-02-05 DIAGNOSIS — R233 Spontaneous ecchymoses: Secondary | ICD-10-CM | POA: Insufficient documentation

## 2022-02-05 MED ORDER — GUAIFENESIN 100 MG/5ML PO LIQD
100.0000 mg | ORAL | 0 refills | Status: AC | PRN
  Filled 2022-02-05: qty 240, 8d supply, fill #0

## 2022-02-05 MED ORDER — BENZONATATE 100 MG PO CAPS
100.0000 mg | ORAL_CAPSULE | Freq: Three times a day (TID) | ORAL | 0 refills | Status: AC | PRN
Start: 2022-02-05 — End: ?
  Filled 2022-02-05: qty 20, 7d supply, fill #0

## 2022-02-05 MED ORDER — SULFAMETHOXAZOLE-TRIMETHOPRIM 800-160 MG PO TABS
1.0000 | ORAL_TABLET | Freq: Two times a day (BID) | ORAL | 0 refills | Status: AC
  Filled 2022-02-05: qty 10, 5d supply, fill #0

## 2022-02-05 NOTE — Progress Notes (Signed)
Established Patient Office Visit  Subjective   Patient ID: Brittany Cardenas, female    DOB: 1991-06-23  Age: 31 y.o. MRN: [id number]  Chief Complaint  Patient presents with   Follow-up    Patient c/o continued cough x 20 days. Patient states she now has laryngitis x 2 days and is bruising on her legs and arms x 2 days    HPI  Brittany Cardenas  is a 31 y/o female who has history of  Allergy, Asthma,Constipation, Herpes Simplex virus, Migraine present for worsening productive cough with thick yellowish /green phlegm, nasal congestion, sore throat, hoarse voice, sinus pressure that has being going on for more than 2 weeks.She denies fever and chills. She was seen at the clinic on 01/20/22 provided symptom management. She went to the Urgent clinic the same day and was treated with Prednisone taper.  She had Chest X ray done yesterday and No active cardiopulmonary disease. She also reports of intermittent bruising to her body, denies hematuria, hematochezia, hemoptysis and active bleeding. Overall, she states that she's not feeling well and offers no further complaint.  Review of Systems  Constitutional: Negative.   Respiratory:  Positive for cough and shortness of breath.   Cardiovascular: Negative.   Skin: Negative.   Neurological: Negative.   Endo/Heme/Allergies:  Bruises/bleeds easily.      Objective:     BP 119/86 (BP Location: Right Arm, Patient Position: Sitting, Cuff Size: Normal)   Pulse 90   Temp 98.3 F (36.8 C) (Oral)   Resp 16   Ht 5\' 4"  (1.626 m)   Wt 155 lb 11.2 oz (70.6 kg)   LMP 01/27/2022 (Exact Date) Comment: IUD  SpO2 97%   BMI 26.73 kg/m  BP Readings from Last 3 Encounters:  02/05/22 119/86  01/20/22 117/82  01/20/22 129/84   Wt Readings from Last 3 Encounters:  02/05/22 155 lb 11.2 oz (70.6 kg)  01/20/22 158 lb 3.2 oz (71.8 kg)  01/20/22 158 lb 9.6 oz (71.9 kg)      Physical Exam HENT:     Head: Normocephalic and atraumatic.      Nose:     Right Sinus: Maxillary sinus tenderness present.     Left Sinus: Maxillary sinus tenderness present.     Mouth/Throat:     Mouth: Mucous membranes are moist.     Pharynx: Oropharynx is clear. No posterior oropharyngeal erythema or uvula swelling.     Tonsils: No tonsillar exudate or tonsillar abscesses.  Cardiovascular:     Rate and Rhythm: Normal rate and regular rhythm.     Pulses: Normal pulses.     Heart sounds: Normal heart sounds.  Pulmonary:     Effort: Pulmonary effort is normal.     Breath sounds: Normal breath sounds.  Skin:    Findings: Bruising (scattered bruises to arm) present.  Neurological:     General: No focal deficit present.     Mental Status: She is alert and oriented to person, place, and time. Mental status is at baseline.  Psychiatric:        Mood and Affect: Mood normal.        Behavior: Behavior normal.        Thought Content: Thought content normal.        Judgment: Judgment normal.      No results found for any visits on 02/05/22.  Last CBC Lab Results  Component Value Date   WBC 6.9 10/02/2021   HGB 13.2  10/02/2021   HCT 39.1 10/02/2021   MCV 91 10/02/2021   MCH 30.6 10/02/2021   RDW 12.3 10/02/2021   PLT 275 10/02/2021   Last metabolic panel Lab Results  Component Value Date   GLUCOSE 91 06/18/2021   NA 137 06/18/2021   K 3.8 06/18/2021   CL 103 06/18/2021   CO2 27 06/18/2021   BUN 13 06/18/2021   CREATININE 0.61 06/18/2021   GFRNONAA >60 06/18/2021   CALCIUM 9.3 06/18/2021   PROT 8.0 06/18/2021   ALBUMIN 4.4 06/18/2021   LABGLOB 2.9 09/25/2020   AGRATIO 1.4 09/25/2020   BILITOT 0.5 06/18/2021   ALKPHOS 59 06/18/2021   AST 26 06/18/2021   ALT 22 06/18/2021   ANIONGAP 7 06/18/2021   Last lipids Lab Results  Component Value Date   CHOL 128 09/25/2020   HDL 54 09/25/2020   LDLCALC 59 09/25/2020   TRIG 72 09/25/2020   CHOLHDL 2.4 09/25/2020   Last thyroid functions Lab Results  Component Value Date    TSH 1.530 10/02/2021      The ASCVD Risk score (Arnett DK, et al., 2019) failed to calculate for the following reasons:   The 2019 ASCVD risk score is only valid for ages 28 to 37    Assessment & Plan:   1. Easy bruising -Unknown etiology of bruises, will check CBC with platelet.  She was advised to go to the emergency room for hematuria, hemoptysis, hematochezia or active bleeding. - CBC w/Diff; Future - CBC w/Diff  2. Acute cough -Possible upper respiratory viral infection, bronchitis, sinusitis, she was started on Bactrim.  She was educated on medication side effects and advised to notify clinic.  She was advised to take Tessalon Perles at bedtime and Robitussin during the day, increase oral fluid intake.  She was advised to go to the emergency room for worsening symptoms. - sulfamethoxazole-trimethoprim (BACTRIM DS) 800-160 MG tablet; Take 1 tablet by mouth 2 (two) times daily.  Dispense: 10 tablet; Refill: 0 - benzonatate (TESSALON PERLES) 100 MG capsule; Take 1 capsule (100 mg total) by mouth 3 (three) times daily as needed for cough.  Dispense: 20 capsule; Refill: 0 - guaiFENesin (ROBITUSSIN) 100 MG/5ML liquid; Take 5 mLs (100 mg total) by mouth every 4 (four) hours as needed for cough or to loosen phlegm.  Dispense: 240 mL; Refill: 0   Return in about 4 weeks (around 03/05/2022), or if symptoms worsen or fail to improve.    Brittini Brubeck Trellis Paganini, NP

## 2022-02-06 ENCOUNTER — Encounter: Payer: Self-pay | Admitting: Pharmacy Technician

## 2022-02-06 LAB — CBC WITH DIFFERENTIAL/PLATELET
Basophils Absolute: 0 10*3/uL (ref 0.0–0.2)
Basos: 0 %
EOS (ABSOLUTE): 0.3 10*3/uL (ref 0.0–0.4)
Eos: 3 %
Hematocrit: 40 % (ref 34.0–46.6)
Hemoglobin: 13 g/dL (ref 11.1–15.9)
Immature Grans (Abs): 0 10*3/uL (ref 0.0–0.1)
Immature Granulocytes: 0 %
Lymphocytes Absolute: 1.5 10*3/uL (ref 0.7–3.1)
Lymphs: 12 %
MCH: 29.3 pg (ref 26.6–33.0)
MCHC: 32.5 g/dL (ref 31.5–35.7)
MCV: 90 fL (ref 79–97)
Monocytes Absolute: 0.7 10*3/uL (ref 0.1–0.9)
Monocytes: 6 %
Neutrophils Absolute: 9.7 10*3/uL — ABNORMAL HIGH (ref 1.4–7.0)
Neutrophils: 79 %
Platelets: 329 10*3/uL (ref 150–450)
RBC: 4.44 x10E6/uL (ref 3.77–5.28)
RDW: 12.2 % (ref 11.7–15.4)
WBC: 12.2 10*3/uL — ABNORMAL HIGH (ref 3.4–10.8)

## 2022-02-06 NOTE — Patient Outreach (Signed)
Patient only signed DOH Attestation.  Would need to provide current year's household income if PAP medications were needed.  Annalei Friesz J. Corine Solorio Patient Advocate Specialist [facility]  

## 2022-02-12 ENCOUNTER — Ambulatory Visit: Payer: Self-pay | Admitting: Gerontology

## 2022-03-05 ENCOUNTER — Ambulatory Visit: Payer: Self-pay | Admitting: Gerontology

## 2022-03-14 ENCOUNTER — Other Ambulatory Visit (HOSPITAL_COMMUNITY): Payer: Self-pay

## 2022-09-23 ENCOUNTER — Telehealth: Payer: Self-pay

## 2022-10-12 ENCOUNTER — Encounter: Payer: Self-pay | Admitting: Advanced Practice Midwife

## 2022-10-12 ENCOUNTER — Ambulatory Visit (LOCAL_COMMUNITY_HEALTH_CENTER): Payer: Self-pay | Admitting: Advanced Practice Midwife

## 2022-10-12 VITALS — BP 111/72 | HR 87 | Temp 98.2°F | Wt 164.0 lb

## 2022-10-12 DIAGNOSIS — R87619 Unspecified abnormal cytological findings in specimens from cervix uteri: Secondary | ICD-10-CM

## 2022-10-12 DIAGNOSIS — Z3009 Encounter for other general counseling and advice on contraception: Secondary | ICD-10-CM

## 2022-10-12 DIAGNOSIS — Z01419 Encounter for gynecological examination (general) (routine) without abnormal findings: Secondary | ICD-10-CM

## 2022-10-12 DIAGNOSIS — Z8719 Personal history of other diseases of the digestive system: Secondary | ICD-10-CM

## 2022-10-12 DIAGNOSIS — Z8709 Personal history of other diseases of the respiratory system: Secondary | ICD-10-CM

## 2022-10-12 LAB — WET PREP FOR TRICH, YEAST, CLUE
Trichomonas Exam: NEGATIVE
Yeast Exam: NEGATIVE

## 2022-10-12 LAB — HEMOGLOBIN, FINGERSTICK: Hemoglobin: 13.5 g/dL (ref 11.1–15.9)

## 2022-10-12 LAB — HM HIV SCREENING LAB: HM HIV Screening: NEGATIVE

## 2022-10-12 NOTE — Progress Notes (Signed)
[facility] Number: [phone]    Family Planning Visit- Initial Visit  Subjective:  Brittany Cardenas is a 32 y.o. SHF nonsmoker [id number] (82 yo daughter)  being seen today for an initial annual visit and to discuss reproductive life planning.  The patient is currently using IUD or IUS for pregnancy prevention. Patient reports   does not want a pregnancy in the next year.     report they are looking for a method that provides High efficacy at preventing pregnancy  Patient has the following medical conditions has Cervical dysplasia; HSV infection; History of asthma; History of migraine; History of constipation; Elevated heart rate with elevated blood pressure without diagnosis of hypertension; Tonsillitis with exudate; Feeling worried; Enlarged lymph node; Vaginal discharge; Epistaxis; Other fatigue; Choking sensation; Shortness of breath; Cough; Sore throat; and Easy bruising on their problem list.  Chief Complaint  Patient presents with   Contraception    Patient reports here for physical, pap. Last PE 08/01/21. Last pap neg HPV + 08/01/21. 2021 pap ASCUS HPV+. Paraguard inserted 2018. LMP 09/15/22. Last sex 07/02/2022 without condom; not with that partner anymore. Last dental exam 7 years ago. Last cig age 29, last ETOH last night (1 beer) q 3 mo. C/o nausea, bloating, diarrhea, BM q 7 days; primary care MD gave her a GI referral and she is going to call for apt. 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  Last HSV outbreak 08/2022  Patient denies vaping, cigars, MJ  Body mass index is 28.15 kg/m. - Patient is eligible for diabetes screening based on BMI> 25 and age Q000111Q?  not applicable Q000111Q ordered? not applicable  Patient reports 2  partner/s in last year. Desires STI screening?  Yes  Has patient been screened once for HCV in the past?  No  No results found for:  "HCVAB"  Does the patient have current drug use (including MJ), have a partner with drug use, and/or has been incarcerated since last result? No  If yes-- Screen for HCV through [facility]   Does the patient meet criteria for HBV testing? No  Criteria:  -Household, sexual or needle sharing contact with HBV -History of drug use -HIV positive -Those with known Hep C   Health Maintenance Due  Topic Date Due   Hepatitis C Screening  Never done   COVID-19 Vaccine (3 - Pfizer risk series) 01/30/2020   INFLUENZA VACCINE  Never done   PAP SMEAR-Modifier  08/01/2022    Review of Systems  Gastrointestinal:  Positive for nausea (with dizziness, vomits yellow bile yesterday, has referral to GI).  Neurological:  Positive for dizziness (when having a BM, no LOC) and headaches (3x/wk always over right eye or occiput relieved with caffeine, sleep, migraine pills).  All other systems reviewed and are negative.   The following portions of the patient's history were reviewed and updated as appropriate: allergies, current medications, past family history, past medical history, past social history, past surgical history and problem list. Problem list updated.   See flowsheet for other program required questions.  Objective:   Vitals:   10/12/22 1314  BP: 111/72  Pulse: 87  Temp: 98.2 F (36.8 C)  Weight: 164 lb (74.4 kg)    Physical Exam Constitutional:      Appearance: Normal appearance. She is normal weight.  HENT:     Head: Normocephalic and atraumatic.  Mouth/Throat:     Mouth: Mucous membranes are moist.     Comments: Last dental exam 7 years ago Eyes:     Conjunctiva/sclera: Conjunctivae normal.  Cardiovascular:     Rate and Rhythm: Normal rate and regular rhythm.  Pulmonary:     Effort: Pulmonary effort is normal.     Breath sounds: Normal breath sounds.  Chest:  Breasts:    Right: Normal.     Left: Normal.  Abdominal:     Palpations: Abdomen is soft.      Comments: Soft without masses  Genitourinary:    General: Normal vulva.     Exam position: Lithotomy position.     Vagina: Vaginal discharge (mucousy white leukorrhea, ph<4.5) present.     Cervix: Friability (friable to pap) present.     Uterus: Normal.      Adnexa: Right adnexa normal and left adnexa normal.     Rectum: Normal.     Comments: IUD string visible Friable to pap Musculoskeletal:        General: Normal range of motion.     Cervical back: Normal range of motion and neck supple.  Skin:    General: Skin is warm and dry.  Neurological:     Mental Status: She is alert.  Psychiatric:        Mood and Affect: Mood normal.       Assessment and Plan:  [name] is a 32 y.o. female presenting to the [facility] Department for an initial annual wellness/contraceptive visit  Contraception counseling: Reviewed options based on patient desire and reproductive life plan. Patient is interested in IUD or IUS. This was provided to the patient today.  if not why not clearly documented  Risks, benefits, and typical effectiveness rates were reviewed.  Questions were answered.  Written information was also given to the patient to review.    The patient will follow up in  1 years for surveillance.  The patient was told to call with any further questions, or with any concerns about this method of contraception.  Emphasized use of condoms 100% of the time for STI prevention.  Need for ECP was assessed. Patient reported not meeting criteria.  Reviewed options and patient desired No method of ECP, declined all    1. Well woman exam with routine gynecological exam Please give dental list to pt Please give primary care MD list to pt Rx given for Acyclovir 800 mg  daily for HSV suppression per pt request   - Chlamydia/Gonorrhea [facility] - HIV [facility] [place] [facility] - Syphilis Serology, [place] [facility] - WET PREP FOR [place], YEAST, CLUE - Hemoglobin, venipuncture  2.  History of constipation C/o constipation, diarrhea, bloating, lactose intolerance, gluten problems, has BM q 7 days Was given a referral to a GI doctor and plans to call  3. History of asthma Has Albuterol     Return if symptoms worsen or fail to improve, for yearly physical exam.  No future appointments.  Herbie Saxon, CNM

## 2022-10-12 NOTE — Progress Notes (Signed)
In House labs reviewed at visit. Prescription written by provider given to patient. BThiele RN

## 2022-10-17 LAB — IGP, APTIMA HPV
HPV Aptima: POSITIVE — AB
PAP Smear Comment: 0

## 2022-10-20 ENCOUNTER — Telehealth: Payer: Self-pay

## 2022-10-20 NOTE — Telephone Encounter (Signed)
Phone call to pt at [phone] using [place] Language interpreter. Pt stated she does not need interpreter; proceeded without interpreter services. Discussed pap result and colpo referral. Pt states she does desire to have the recommended follow-up done.  Further states she prefers a female doctor. 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  States she recently got insurance, [employer] by [employer].

## 2022-10-20 NOTE — Telephone Encounter (Signed)
Calling pt re pap smear result from 10/12/22 specimen and colpo referral.

## 2022-10-22 NOTE — Telephone Encounter (Addendum)
Phone call to patient to discuss the details of the colpo referral.  Patient stated she is fine with going to the following provider in [place], no other preferences. She further expressed that she understands the following information about scheduling appointments, verifying insurance, and billing responsibilities.  [facility]) [place], [place] [id number]  -Colposcopy appts currently out until about end of March/beginning of April 2024 timeframe.  -[facility] stated they take [facility] but will need to verify coverage with patient's particular plan. Pt will need to provide insurance information when she calls [facility].  -Patient is responsible for calling them and making the colpo appointment.  -Patient is responsible for confirming that [facility] takes her insurance and is responsible for associated costs.  -Patient is responsible for calling the facility to cancel or reschedule any appts. There is usually a fee associated with missed appointments, and patient would be responsible for any of these fees as well.

## 2022-10-22 NOTE — Telephone Encounter (Signed)
Colpo referral and associated medical records/labs faxed to [employer].  Sent MyChart message to pt with information.

## 2022-12-04 NOTE — Telephone Encounter (Signed)
Phone call to [employer]. Left message on voicemail inquiring about colpo referral and requested return call to [place] at [phone].

## 2022-12-04 NOTE — Telephone Encounter (Signed)
Received return phone call from [employer] rep.  Pt has colpo scheduled for 12/24/22 at 2:30 pm. [facility] Fax # provided for referral follow-up.

## 2022-12-30 NOTE — Progress Notes (Signed)
See faxed copy of all clinical notes and labs (to be reviewed by Arnetha Courser, CNM and then send for scanning). Included in faxed documentation (screen shots):

## 2023-02-28 IMAGING — CR DG CHEST 2V
1 series · 2 of 2 positions shown · non-contrast
Comparison: 11/03/2018

CLINICAL DATA: Worsening cough and wheezing for 3 weeks.  Asthma.

EXAM:
CHEST - 2 VIEW

[Series 1: dg chest 2 view · 0.14mm/px · 2 of 2 slices shown]
[im 1/2]
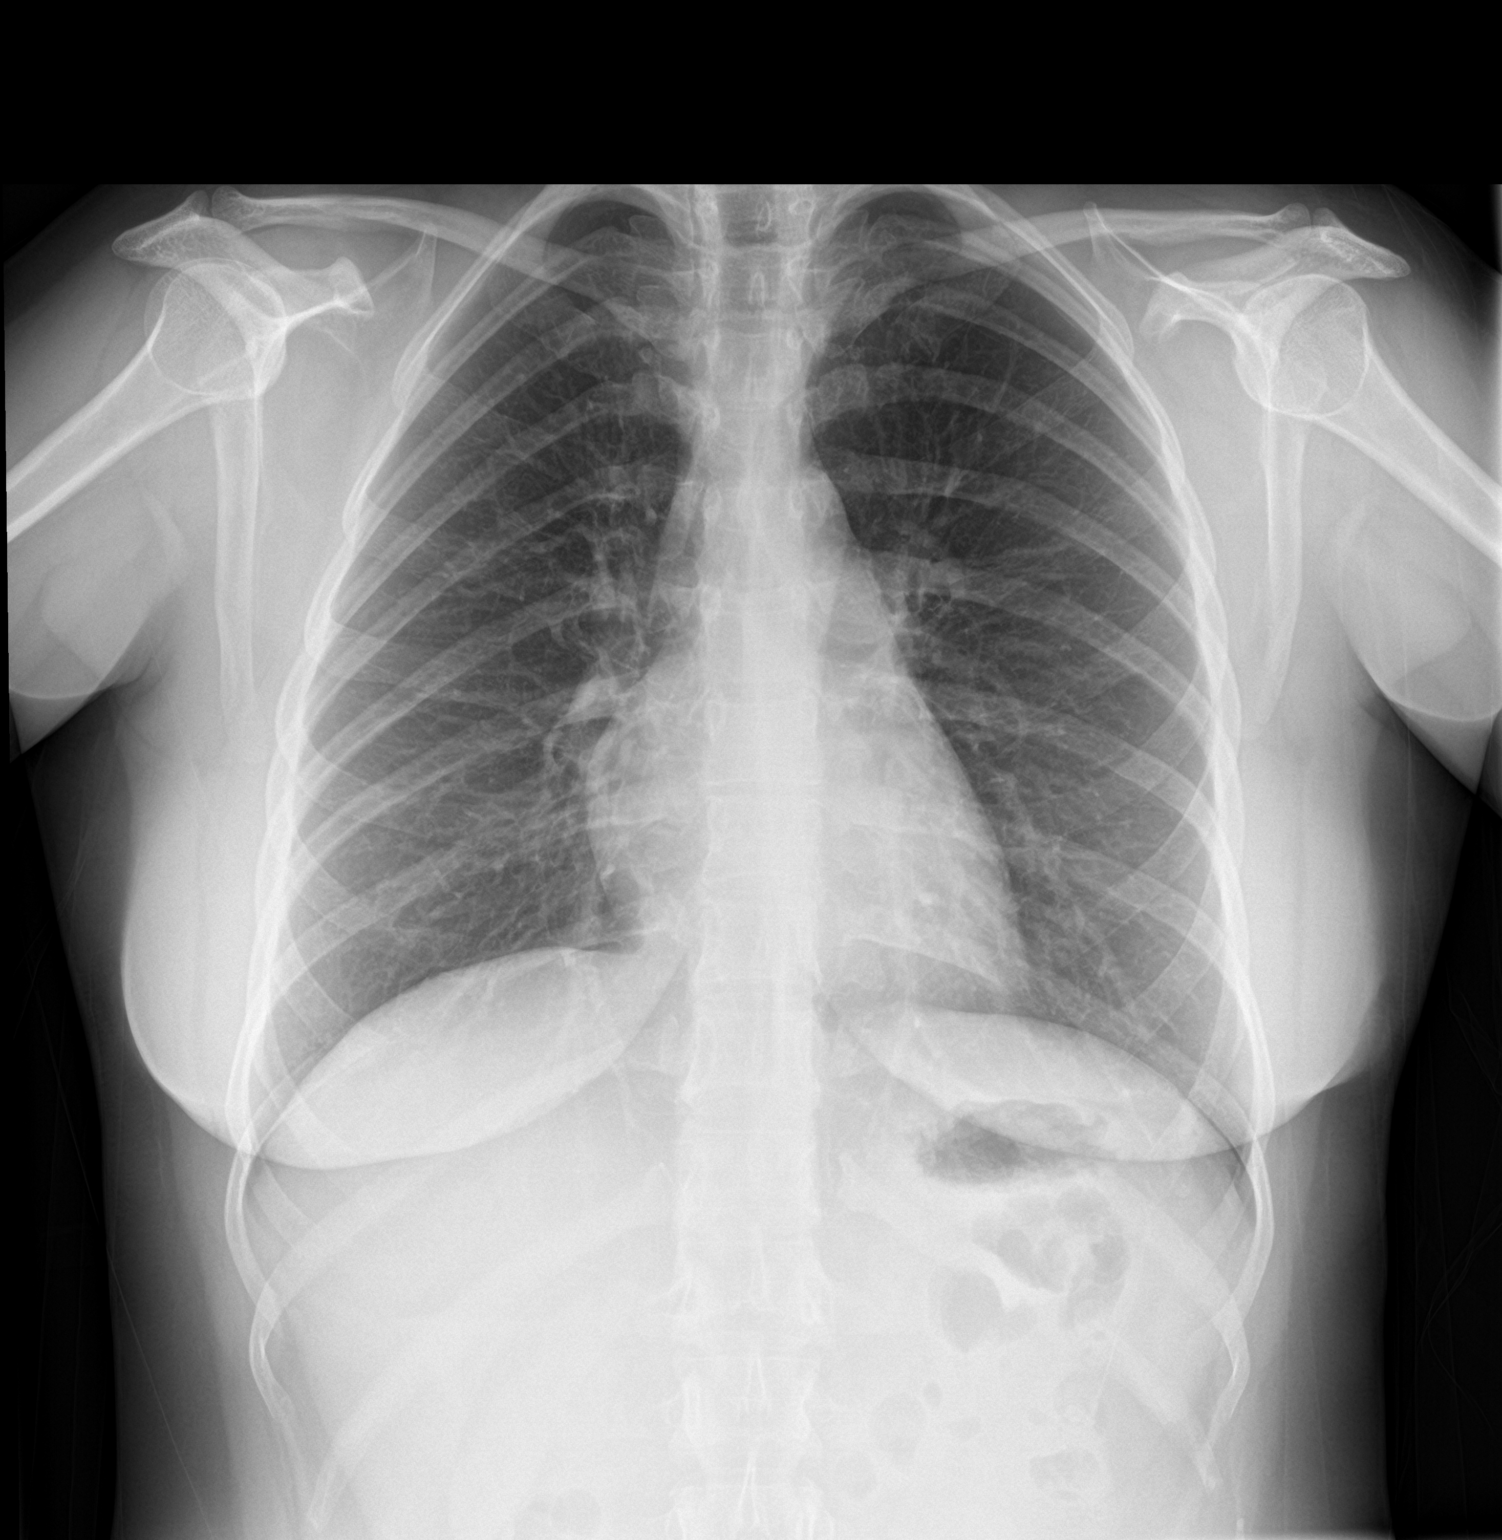
[im 2/2]
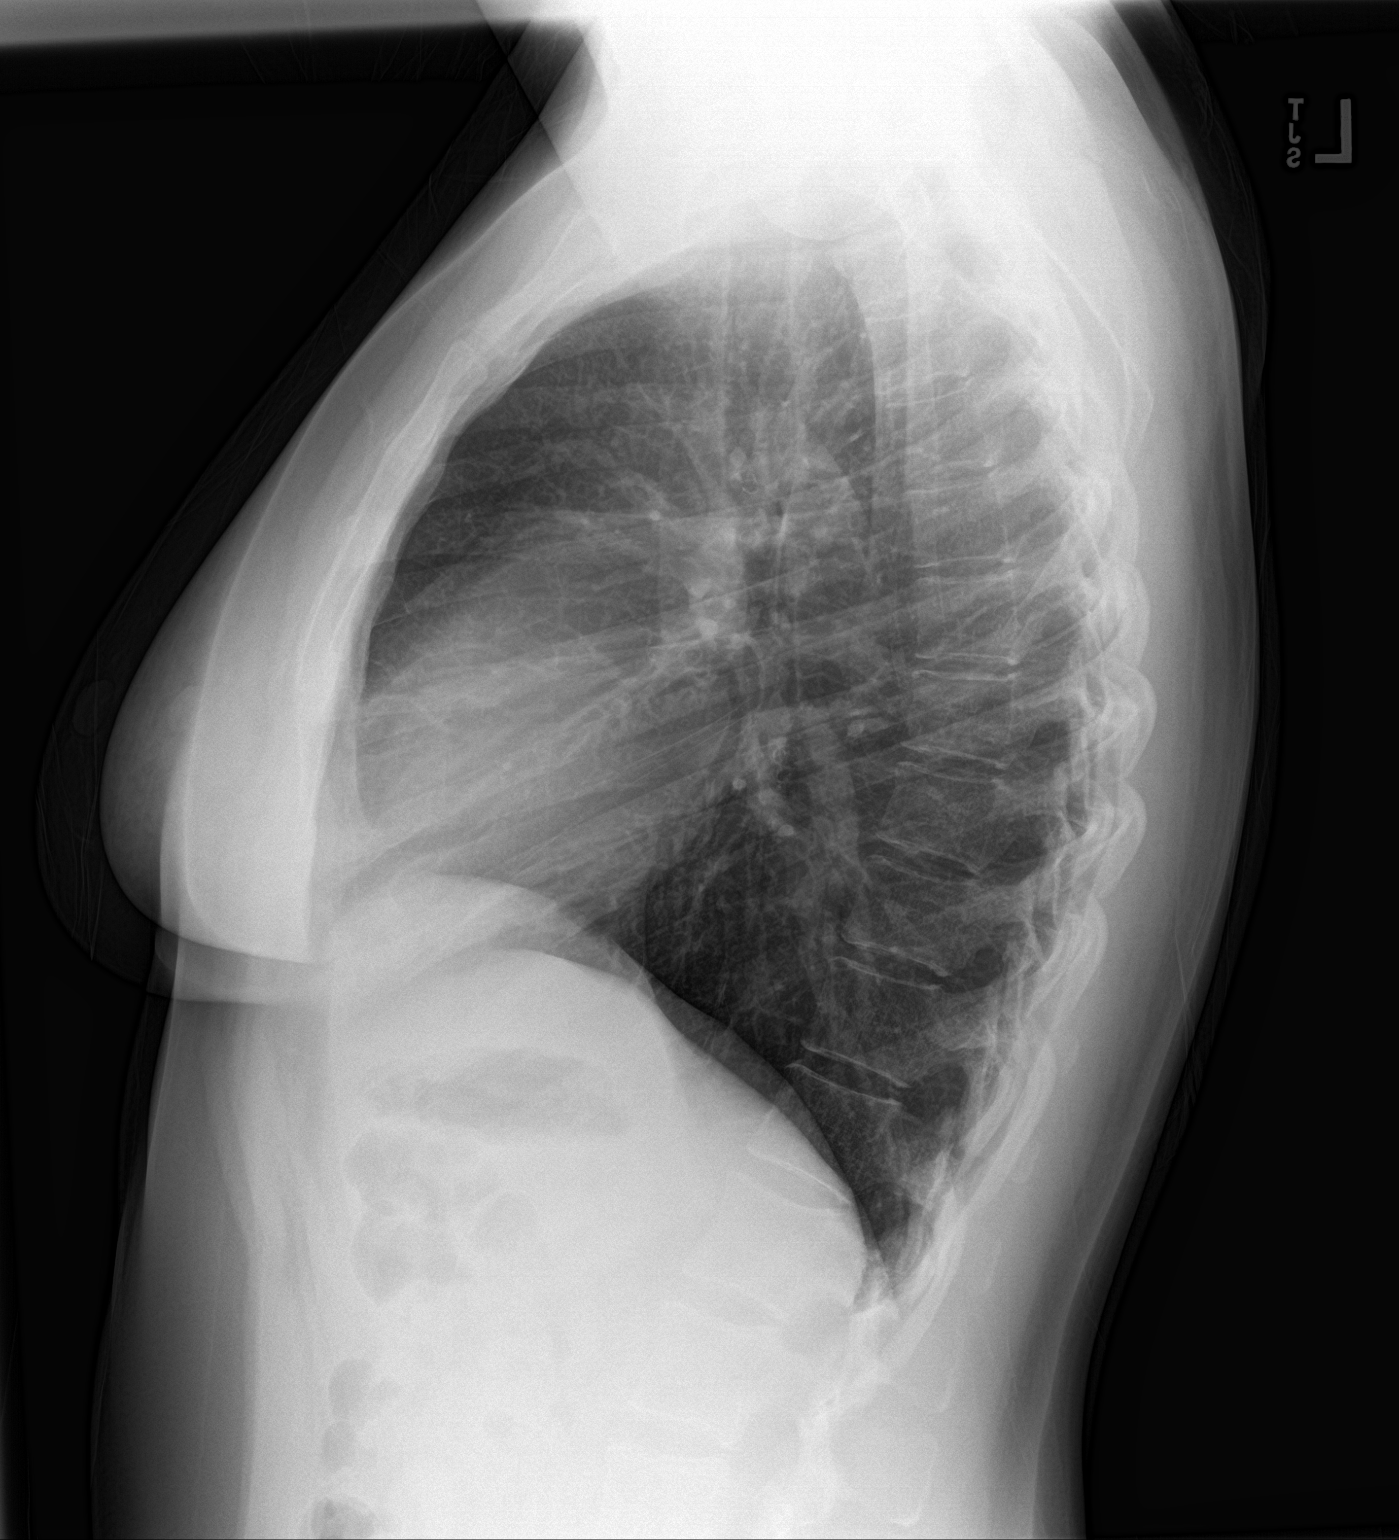

[2 of 2 positions shown; findings below may reference images not displayed]

FINDINGS: The heart size and mediastinal contours are within normal limits.
Both lungs are clear. The visualized skeletal structures are
unremarkable.
IMPRESSION: No active cardiopulmonary disease.

## 2023-04-25 ENCOUNTER — Ambulatory Visit
Admission: EM | Admit: 2023-04-25 | Discharge: 2023-04-25 | Disposition: A | Discharge status: Home / Self Care | Payer: Medicaid Other | Attending: Physician Assistant | Admitting: Physician Assistant

## 2023-04-25 ENCOUNTER — Encounter: Payer: Self-pay | Admitting: Emergency Medicine

## 2023-04-25 DIAGNOSIS — Z1152 Encounter for screening for COVID-19: Secondary | ICD-10-CM | POA: Diagnosis not present

## 2023-04-25 DIAGNOSIS — J069 Acute upper respiratory infection, unspecified: Secondary | ICD-10-CM | POA: Insufficient documentation

## 2023-04-25 DIAGNOSIS — R110 Nausea: Secondary | ICD-10-CM | POA: Diagnosis present

## 2023-04-25 HISTORY — DX: Reserved for concepts with insufficient information to code with codable children: IMO0002

## 2023-04-25 LAB — SARS CORONAVIRUS 2 BY RT PCR: SARS Coronavirus 2 by RT PCR: NEGATIVE

## 2023-04-25 MED ORDER — KETOROLAC TROMETHAMINE 30 MG/ML IJ SOLN
30.0000 mg | Freq: Once | INTRAMUSCULAR | Status: AC
  Administered 2023-04-25: 30 mg via INTRAMUSCULAR

## 2023-04-25 MED ORDER — ONDANSETRON 4 MG PO TBDP
4.0000 mg | ORAL_TABLET | Freq: Once | ORAL | Status: AC
  Administered 2023-04-25: 4 mg via ORAL

## 2023-04-25 MED ORDER — ONDANSETRON HCL 4 MG PO TABS: 4.0000 mg | ORAL_TABLET | Freq: Three times a day (TID) | ORAL | 0 refills | Status: AC | PRN

## 2023-04-25 MED ORDER — PREDNISONE 20 MG PO TABS
40.0000 mg | ORAL_TABLET | Freq: Every day | ORAL | 0 refills | Status: AC
Start: 2023-04-25 — End: 2023-04-30

## 2023-04-25 NOTE — ED Provider Notes (Signed)
Brittany Cardenas    CSN: [id number] Arrival date & time: 04/25/23  0947      History   Chief Complaint Chief Complaint  Patient presents with   Sore Throat   Generalized Body Aches   Headache    HPI Brittany Cardenas is a 32 y.o. female.   Patient presents with sore throat, cough, congestion that started 2 days ago.  She reports she has a history of asthma and has felt some increased wheezing.  She complains of nausea, bodyaches and chills, sweats.  She has a history of lupus and is unsure if the pain is related to her lupus.  She also reports she was out in the sun working and has many mosquito bites.  She reports mild itching to these mosquito bites.  She denies shortness of breath, trouble breathing, abdominal pain, diarrhea, vomiting..     Past Medical History:  Diagnosis Date   Allergy    Asthma    Patient reported   Constipation    Herpes simplex virus (HSV) infection    Patient reported   Lupus (HCC)    Migraine    Ovarian cyst 2012   per patient report   Vaginal Pap smear, abnormal     Patient Active Problem List   Diagnosis Date Noted   Abnormal Pap smear of cervix 08/01/21 neg HPV + 10/12/2022   Easy bruising 02/05/2022   Cough 01/20/2022   Sore throat 01/20/2022   Choking sensation 11/13/2021   Shortness of breath 11/13/2021   Epistaxis 06/18/2021   Other fatigue 06/18/2021   Enlarged lymph node 06/04/2021   Vaginal discharge 06/04/2021   Feeling worried 03/19/2021   Tonsillitis with exudate 02/26/2021   Elevated heart rate with elevated blood pressure without diagnosis of hypertension 10/22/2020   History of asthma 09/25/2020   History of migraine 09/25/2020   History of constipation 09/25/2020   HSV infection 05/24/2020   Cervical dysplasia 10/11/2015    Past Surgical History:  Procedure Laterality Date   Denies surgical hx     NO PAST SURGERIES      OB History     Gravida  2   Para  1   Term  1   Preterm  0   AB   1   Living  1      SAB  1   IAB      Ectopic      Multiple      Live Births  1            Home Medications    Prior to Admission medications   Medication Sig Start Date End Date Taking? Authorizing Provider  hydroxychloroquine (PLAQUENIL) 200 MG tablet Take 200 mg by mouth 2 (two) times daily. 04/16/23  Yes [provider]  albuterol (VENTOLIN HFA) 108 (90 Base) MCG/ACT inhaler Inhale 1-2 puffs into the lungs every 6 (six) hours as needed for wheezing or shortness of breath. 01/20/22   Iloabachie, Chioma E, NP  benzonatate (TESSALON PERLES) 100 MG capsule Take 1 capsule (100 mg total) by mouth 3 (three) times daily as needed for cough. Patient not taking: Reported on 10/12/2022 02/05/22   Iloabachie, Chioma E, NP  guaiFENesin (ROBITUSSIN) 100 MG/5ML liquid Take 5 mLs (100 mg total) by mouth every 4 (four) hours as needed for cough or to loosen phlegm. Patient not taking: Reported on 10/12/2022 02/05/22   Iloabachie, Chioma E, NP  loratadine (CLARITIN) 10 MG tablet Take 10 mg by  mouth daily.    [provider]  ondansetron (ZOFRAN) 4 MG tablet Take 1 tablet (4 mg total) by mouth every 8 (eight) hours as needed for nausea or vomiting. 04/25/23  Yes Ward, Tylene Fantasia, PA-C  PARAGARD INTRAUTERINE COPPER IU by Intrauterine route. Placed ~2018 per pt.    [provider]  predniSONE (DELTASONE) 20 MG tablet Take 2 tablets (40 mg total) by mouth daily for 5 days. 04/25/23 04/30/23 Yes Ward, Tylene Fantasia, PA-C  Spacer/Aero-Holding Chambers (AEROCHAMBER PLUS) inhaler Use as instructed 10/21/21   Domenick Gong, MD  sulfamethoxazole-trimethoprim (BACTRIM DS) 800-160 MG tablet Take 1 tablet by mouth 2 (two) times daily. Patient not taking: Reported on 10/12/2022 02/05/22   Iloabachie, Chioma E, NP  SUMAtriptan (IMITREX) 50 MG tablet TAKE 1 TABLET BY MOUTH ONCE FOR 1 DOSE, MAY REPEAT IN 2 HOURS IF HEADACHE PERSISTS OR RECURS Patient not taking: Reported on 10/12/2022 01/20/22  01/20/23  Rolm Gala, NP    Family History Family History  Problem Relation Age of Onset   Ovarian cancer Paternal Grandmother    Breast cancer Paternal Grandmother 82   Cancer Paternal Grandmother        ovarian cancer   Hyperlipidemia Maternal Grandmother    Hypertension Maternal Grandmother    Varicose Veins Maternal Grandmother    Cancer Maternal Grandfather        kidney cancer   Other Father        murdered   Scoliosis Father    Hypertension Mother    Asthma Mother    Stroke Mother    Migraines Mother    Migraines Sister     Social History Social History   Tobacco Use   Smoking status: Never    Passive exposure: Past   Smokeless tobacco: Never  Vaping Use   Vaping status: Never Used  Substance Use Topics   Alcohol use: Yes    Alcohol/week: 3.0 standard drinks of alcohol    Types: 3 Cans of beer per week    Comment: once a month   Drug use: Never     Allergies   Fish-derived products, Shellfish allergy, Penicillin g, Penicillins, Bee pollen, Latex, and Pollen extract   Review of Systems Review of Systems  Constitutional:  Positive for chills. Negative for fever.  HENT:  Positive for congestion. Negative for ear pain and sore throat.   Eyes:  Negative for pain and visual disturbance.  Respiratory:  Positive for cough and wheezing. Negative for shortness of breath.   Cardiovascular:  Negative for chest pain and palpitations.  Gastrointestinal:  Positive for nausea. Negative for abdominal pain and vomiting.  Genitourinary:  Negative for dysuria and hematuria.  Musculoskeletal:  Positive for myalgias. Negative for arthralgias and back pain.  Skin:  Negative for color change and rash.  Neurological:  Negative for seizures and syncope.  All other systems reviewed and are negative.    Physical Exam Triage Vital Signs ED Triage Vitals [04/25/23 1002]  Encounter Vitals Group     BP 115/78     Systolic BP Percentile      Diastolic BP Percentile       Pulse Rate 92     Resp 17     Temp 98 F (36.7 C)     Temp Source Oral     SpO2 94 %     Weight      Height      Head Circumference      Peak Flow  Pain Score 8     Pain Loc      Pain Education      Exclude from Growth Chart    No data found.  Updated Vital Signs BP 115/78 (BP Location: Left Arm)   Pulse 92   Temp 98 F (36.7 C) (Oral)   Resp 17   SpO2 94%   Visual Acuity Right Eye Distance:   Left Eye Distance:   Bilateral Distance:    Right Eye Near:   Left Eye Near:    Bilateral Near:     Physical Exam Vitals and nursing note reviewed.  Constitutional:      General: She is not in acute distress.    Appearance: She is well-developed.  HENT:     Head: Normocephalic and atraumatic.  Eyes:     Conjunctiva/sclera: Conjunctivae normal.  Cardiovascular:     Rate and Rhythm: Normal rate and regular rhythm.     Heart sounds: No murmur heard. Pulmonary:     Effort: Pulmonary effort is normal. No respiratory distress.     Breath sounds: Normal breath sounds.  Abdominal:     Palpations: Abdomen is soft.     Tenderness: There is no abdominal tenderness.  Musculoskeletal:        General: No swelling.     Cervical back: Neck supple.  Skin:    General: Skin is warm and dry.     Capillary Refill: Capillary refill takes less than 2 seconds.  Neurological:     Mental Status: She is alert.  Psychiatric:        Mood and Affect: Mood normal.      UC Treatments / Results  Labs (all labs ordered are listed, but only abnormal results are displayed) Labs Reviewed  SARS CORONAVIRUS 2 BY RT PCR    EKG   Radiology No results found.  Procedures Procedures (including critical care time)  Medications Ordered in UC Medications  ondansetron (ZOFRAN-ODT) disintegrating tablet 4 mg (4 mg Oral Given 04/25/23 1043)  ketorolac (TORADOL) 30 MG/ML injection 30 mg (30 mg Intramuscular Given 04/25/23 1043)    Initial Impression / Assessment and Plan / UC Course   I have reviewed the triage vital signs and the nursing notes.  Pertinent labs & imaging results that were available during my care of the patient were reviewed by me and considered in my medical decision making (see chart for details).     Suspect patient is experiencing URI/viral illness.  Zofran given in clinic today with improvement of nausea patient reports Toradol in clinic today provided relief of bodyaches.  She does have lupus, body aches and pain may be associated with this versus exacerbation due to URI.  Given increased wheezing will refill her albuterol and treat with prednisone for asthma exacerbation.  Patient overall well-appearing in no acute distress.  Supportive care discussed.  Return precautions discussed. Final Clinical Impressions(s) / UC Diagnoses   Final diagnoses:  Acute upper respiratory infection  Nausea without vomiting     Discharge Instructions      Will call with COVID test results Can use albuterol as needed for shortness of breath or wheezing Take prednisone as prescribed  Can take Tylenol or Ibuprofen      ED Prescriptions     Medication Sig Dispense Auth. Provider   ondansetron (ZOFRAN) 4 MG tablet Take 1 tablet (4 mg total) by mouth every 8 (eight) hours as needed for nausea or vomiting. 20 tablet Ward, Tylene Fantasia, PA-C  predniSONE (DELTASONE) 20 MG tablet Take 2 tablets (40 mg total) by mouth daily for 5 days. 10 tablet Ward, Tylene Fantasia, PA-C      PDMP not reviewed this encounter.   Ward, Tylene Fantasia, PA-C 04/25/23 1130

## 2023-04-25 NOTE — ED Triage Notes (Signed)
Pt presents with multiple symptoms. Reports working in the sun and getting bit by a lot of mosquitoes. States shortly after that she started having a sore throat, body aches, pain when taking a deep breath, headache, and feels like she is sweating too much.

## 2023-04-25 NOTE — Discharge Instructions (Addendum)
Will call with COVID test results Can use albuterol as needed for shortness of breath or wheezing Take prednisone as prescribed  Can take Tylenol or Ibuprofen
